# Patient Record
Sex: Male | Born: 1955 | Race: White | Hispanic: Yes | Marital: Married | Smoking: Never smoker
Health system: Southern US, Community
[De-identification: ages and names within clinical notes are randomized; demographics above are authoritative.]

## PROBLEM LIST (undated history)

## (undated) DIAGNOSIS — M109 Gout, unspecified: Secondary | ICD-10-CM

## (undated) DIAGNOSIS — E079 Disorder of thyroid, unspecified: Secondary | ICD-10-CM

## (undated) HISTORY — PX: KNEE SURGERY: SHX244

---

## 2002-05-20 ENCOUNTER — Encounter: Admission: RE | Admit: 2002-05-20 | Discharge: 2002-05-20 | Payer: Self-pay | Admitting: Family Medicine

## 2002-05-20 ENCOUNTER — Encounter: Payer: Self-pay | Admitting: Family Medicine

## 2002-07-23 ENCOUNTER — Other Ambulatory Visit: Admission: RE | Admit: 2002-07-23 | Discharge: 2002-07-23 | Payer: Self-pay | Admitting: Family Medicine

## 2006-06-05 ENCOUNTER — Ambulatory Visit: Payer: Self-pay | Admitting: Family Medicine

## 2006-06-05 LAB — CONVERTED CEMR LAB: TSH: 6.96 microintl units/mL — ABNORMAL HIGH (ref 0.35–5.50)

## 2006-06-25 ENCOUNTER — Ambulatory Visit: Payer: Self-pay | Admitting: Family Medicine

## 2006-06-25 LAB — CONVERTED CEMR LAB
Chol/HDL Ratio, serum: 5.6
Cholesterol: 204 mg/dL (ref 0–200)
Glucose, Bld: 100 mg/dL — ABNORMAL HIGH (ref 70–99)
HDL: 36.7 mg/dL — ABNORMAL LOW (ref >39.0)
LDL DIRECT: 95.9 mg/dL
Triglyceride fasting, serum: 266 mg/dL (ref 0–149)
VLDL: 53 mg/dL — ABNORMAL HIGH (ref 0–40)

## 2006-07-30 ENCOUNTER — Ambulatory Visit: Payer: Self-pay | Admitting: Family Medicine

## 2006-08-12 ENCOUNTER — Ambulatory Visit: Payer: Self-pay | Admitting: Family Medicine

## 2006-09-12 ENCOUNTER — Ambulatory Visit: Payer: Self-pay | Admitting: Family Medicine

## 2006-10-22 ENCOUNTER — Ambulatory Visit: Payer: Self-pay | Admitting: Gastroenterology

## 2006-11-15 ENCOUNTER — Encounter (INDEPENDENT_AMBULATORY_CARE_PROVIDER_SITE_OTHER): Payer: Self-pay | Admitting: Specialist

## 2006-11-15 ENCOUNTER — Ambulatory Visit: Payer: Self-pay | Admitting: Gastroenterology

## 2007-01-28 ENCOUNTER — Emergency Department (HOSPITAL_COMMUNITY): Admission: EM | Admit: 2007-01-28 | Discharge: 2007-01-29 | Payer: Self-pay | Admitting: Emergency Medicine

## 2007-01-31 ENCOUNTER — Ambulatory Visit: Payer: Self-pay | Admitting: Family Medicine

## 2007-01-31 DIAGNOSIS — M109 Gout, unspecified: Secondary | ICD-10-CM

## 2007-02-03 ENCOUNTER — Telehealth (INDEPENDENT_AMBULATORY_CARE_PROVIDER_SITE_OTHER): Payer: Self-pay | Admitting: *Deleted

## 2007-02-07 ENCOUNTER — Ambulatory Visit: Payer: Self-pay | Admitting: Family Medicine

## 2007-02-07 ENCOUNTER — Telehealth (INDEPENDENT_AMBULATORY_CARE_PROVIDER_SITE_OTHER): Payer: Self-pay | Admitting: Family Medicine

## 2007-02-11 ENCOUNTER — Telehealth (INDEPENDENT_AMBULATORY_CARE_PROVIDER_SITE_OTHER): Payer: Self-pay | Admitting: *Deleted

## 2007-02-19 LAB — CONVERTED CEMR LAB
BUN: 18 mg/dL (ref 6–23)
CO2: 23 meq/L (ref 19–32)
Calcium: 9.4 mg/dL (ref 8.4–10.5)
Chloride: 108 meq/L (ref 96–112)
Creatinine, Ser: 1.55 mg/dL — ABNORMAL HIGH (ref 0.40–1.50)
Glucose, Bld: 119 mg/dL — ABNORMAL HIGH (ref 70–99)
Potassium: 5.1 meq/L (ref 3.5–5.3)
Sodium: 142 meq/L (ref 135–145)
Uric Acid, Serum: 9.1 mg/dL — ABNORMAL HIGH (ref 2.4–7.0)

## 2007-04-03 ENCOUNTER — Ambulatory Visit: Payer: Self-pay | Admitting: Family Medicine

## 2007-04-03 ENCOUNTER — Telehealth (INDEPENDENT_AMBULATORY_CARE_PROVIDER_SITE_OTHER): Payer: Self-pay | Admitting: *Deleted

## 2007-04-03 DIAGNOSIS — E039 Hypothyroidism, unspecified: Secondary | ICD-10-CM | POA: Insufficient documentation

## 2007-04-03 LAB — CONVERTED CEMR LAB: Rapid Strep: NEGATIVE

## 2007-04-04 ENCOUNTER — Ambulatory Visit: Payer: Self-pay | Admitting: Family Medicine

## 2007-04-07 ENCOUNTER — Telehealth (INDEPENDENT_AMBULATORY_CARE_PROVIDER_SITE_OTHER): Payer: Self-pay | Admitting: *Deleted

## 2007-04-07 LAB — CONVERTED CEMR LAB
TSH: 4.276 microintl units/mL (ref 0.350–5.50)
Uric Acid, Serum: 7.4 mg/dL — ABNORMAL HIGH (ref 2.4–7.0)

## 2007-05-28 ENCOUNTER — Ambulatory Visit: Payer: Self-pay | Admitting: Family Medicine

## 2007-06-03 LAB — CONVERTED CEMR LAB
Cholesterol: 202 mg/dL (ref 0–200)
HDL: 26.3 mg/dL — ABNORMAL LOW (ref >39.0)
Total CHOL/HDL Ratio: 7.7
Triglycerides: 658 mg/dL (ref 0–149)

## 2007-06-06 ENCOUNTER — Telehealth (INDEPENDENT_AMBULATORY_CARE_PROVIDER_SITE_OTHER): Payer: Self-pay | Admitting: *Deleted

## 2007-06-18 ENCOUNTER — Ambulatory Visit: Payer: Self-pay | Admitting: Family Medicine

## 2007-06-23 ENCOUNTER — Encounter (INDEPENDENT_AMBULATORY_CARE_PROVIDER_SITE_OTHER): Payer: Self-pay | Admitting: *Deleted

## 2007-06-23 LAB — CONVERTED CEMR LAB
Cholesterol: 208 mg/dL (ref 0–200)
Direct LDL: 102.2 mg/dL
Total CHOL/HDL Ratio: 6.1
VLDL: 57 mg/dL — ABNORMAL HIGH (ref 0–40)

## 2007-09-10 ENCOUNTER — Ambulatory Visit: Payer: Self-pay | Admitting: Family Medicine

## 2007-09-10 DIAGNOSIS — N390 Urinary tract infection, site not specified: Secondary | ICD-10-CM | POA: Insufficient documentation

## 2007-09-10 DIAGNOSIS — R1012 Left upper quadrant pain: Secondary | ICD-10-CM

## 2007-09-16 ENCOUNTER — Telehealth (INDEPENDENT_AMBULATORY_CARE_PROVIDER_SITE_OTHER): Payer: Self-pay | Admitting: *Deleted

## 2007-09-17 ENCOUNTER — Telehealth (INDEPENDENT_AMBULATORY_CARE_PROVIDER_SITE_OTHER): Payer: Self-pay | Admitting: *Deleted

## 2007-10-01 ENCOUNTER — Ambulatory Visit: Payer: Self-pay | Admitting: Internal Medicine

## 2007-10-01 LAB — CONVERTED CEMR LAB: RBC / HPF: NONE SEEN (ref <3)

## 2007-10-06 ENCOUNTER — Telehealth (INDEPENDENT_AMBULATORY_CARE_PROVIDER_SITE_OTHER): Payer: Self-pay | Admitting: *Deleted

## 2007-10-13 ENCOUNTER — Ambulatory Visit: Payer: Self-pay | Admitting: Internal Medicine

## 2007-10-14 ENCOUNTER — Ambulatory Visit: Payer: Self-pay | Admitting: Internal Medicine

## 2007-10-17 LAB — CONVERTED CEMR LAB
ALT: 27 units/L (ref 0–53)
AST: 24 units/L (ref 0–37)
Albumin: 3.9 g/dL (ref 3.5–5.2)
Amylase: 68 units/L (ref 27–131)
Basophils Absolute: 0 10*3/uL (ref 0.0–0.1)
Basophils Relative: 0.9 % (ref 0.0–1.0)
Eosinophils Relative: 5.7 % — ABNORMAL HIGH (ref 0.0–5.0)
Hemoglobin: 14.3 g/dL (ref 13.0–17.0)
Lymphocytes Relative: 53.3 % — ABNORMAL HIGH (ref 12.0–46.0)
MCHC: 32.9 g/dL (ref 30.0–36.0)
Monocytes Relative: 17.5 % — ABNORMAL HIGH (ref 3.0–12.0)
Neutro Abs: 0.6 10*3/uL — ABNORMAL LOW (ref 1.4–7.7)
RBC: 4.85 M/uL (ref 4.22–5.81)
Total Protein: 7 g/dL (ref 6.0–8.3)

## 2007-10-19 ENCOUNTER — Telehealth (INDEPENDENT_AMBULATORY_CARE_PROVIDER_SITE_OTHER): Payer: Self-pay | Admitting: *Deleted

## 2007-11-25 ENCOUNTER — Ambulatory Visit: Payer: Self-pay | Admitting: Family Medicine

## 2007-11-28 ENCOUNTER — Ambulatory Visit: Payer: Self-pay | Admitting: Internal Medicine

## 2007-11-28 LAB — CONVERTED CEMR LAB
Basophils Absolute: 0 10*3/uL (ref 0.0–0.1)
Eosinophils Absolute: 0.4 10*3/uL (ref 0.0–0.7)
MCHC: 33.3 g/dL (ref 30.0–36.0)
MCV: 90.8 fL (ref 78.0–100.0)
Monocytes Absolute: 0.5 10*3/uL (ref 0.1–1.0)
Neutrophils Relative %: 53.9 % (ref 43.0–77.0)
Platelets: 208 10*3/uL (ref 150–400)

## 2008-02-09 ENCOUNTER — Telehealth (INDEPENDENT_AMBULATORY_CARE_PROVIDER_SITE_OTHER): Payer: Self-pay | Admitting: *Deleted

## 2008-02-12 ENCOUNTER — Telehealth (INDEPENDENT_AMBULATORY_CARE_PROVIDER_SITE_OTHER): Payer: Self-pay | Admitting: *Deleted

## 2008-06-09 ENCOUNTER — Ambulatory Visit: Payer: Self-pay | Admitting: Family Medicine

## 2008-06-09 DIAGNOSIS — M7710 Lateral epicondylitis, unspecified elbow: Secondary | ICD-10-CM | POA: Insufficient documentation

## 2008-06-28 ENCOUNTER — Encounter (INDEPENDENT_AMBULATORY_CARE_PROVIDER_SITE_OTHER): Payer: Self-pay | Admitting: *Deleted

## 2008-07-12 ENCOUNTER — Telehealth (INDEPENDENT_AMBULATORY_CARE_PROVIDER_SITE_OTHER): Payer: Self-pay | Admitting: *Deleted

## 2008-07-13 ENCOUNTER — Ambulatory Visit: Payer: Self-pay | Admitting: Family Medicine

## 2008-07-13 LAB — CONVERTED CEMR LAB: OCCULT 1: NEGATIVE

## 2008-07-14 ENCOUNTER — Encounter (INDEPENDENT_AMBULATORY_CARE_PROVIDER_SITE_OTHER): Payer: Self-pay | Admitting: *Deleted

## 2008-07-19 ENCOUNTER — Ambulatory Visit: Payer: Self-pay | Admitting: Family Medicine

## 2008-07-20 LAB — CONVERTED CEMR LAB
ALT: 31 units/L (ref 0–53)
AST: 22 units/L (ref 0–37)
GGT: 28 units/L (ref 7–51)
Total Bilirubin: 0.9 mg/dL (ref 0.3–1.2)
Total Protein: 7.6 g/dL (ref 6.0–8.3)

## 2008-07-21 ENCOUNTER — Encounter (INDEPENDENT_AMBULATORY_CARE_PROVIDER_SITE_OTHER): Payer: Self-pay | Admitting: *Deleted

## 2008-07-22 LAB — CONVERTED CEMR LAB
HCV Ab: NEGATIVE
Hepatitis B Surface Ag: NEGATIVE

## 2008-07-26 ENCOUNTER — Encounter (INDEPENDENT_AMBULATORY_CARE_PROVIDER_SITE_OTHER): Payer: Self-pay | Admitting: *Deleted

## 2008-09-27 ENCOUNTER — Ambulatory Visit: Payer: Self-pay | Admitting: Family Medicine

## 2008-09-27 ENCOUNTER — Encounter (INDEPENDENT_AMBULATORY_CARE_PROVIDER_SITE_OTHER): Payer: Self-pay | Admitting: *Deleted

## 2008-09-27 DIAGNOSIS — J309 Allergic rhinitis, unspecified: Secondary | ICD-10-CM | POA: Insufficient documentation

## 2008-09-27 DIAGNOSIS — R42 Dizziness and giddiness: Secondary | ICD-10-CM

## 2009-02-28 ENCOUNTER — Ambulatory Visit: Payer: Self-pay | Admitting: Family Medicine

## 2009-02-28 DIAGNOSIS — K219 Gastro-esophageal reflux disease without esophagitis: Secondary | ICD-10-CM | POA: Insufficient documentation

## 2009-03-03 LAB — CONVERTED CEMR LAB
ALT: 29 units/L (ref 0–53)
Alkaline Phosphatase: 47 units/L (ref 39–117)
Amylase: 92 units/L (ref 27–131)
BUN: 19 mg/dL (ref 6–23)
Bilirubin, Direct: 0 mg/dL (ref 0.0–0.3)
Calcium: 9.5 mg/dL (ref 8.4–10.5)
Cholesterol: 220 mg/dL — ABNORMAL HIGH (ref 0–200)
Creatinine, Ser: 1.2 mg/dL (ref 0.4–1.5)
Direct LDL: 104 mg/dL
Eosinophils Relative: 5.1 % — ABNORMAL HIGH (ref 0.0–5.0)
GFR calc non Af Amer: 67.36 mL/min (ref >60)
H Pylori IgG: POSITIVE
HDL: 35.4 mg/dL — ABNORMAL LOW (ref >39.00)
Lymphocytes Relative: 29.9 % (ref 12.0–46.0)
MCV: 91.6 fL (ref 78.0–100.0)
Monocytes Absolute: 0.5 10*3/uL (ref 0.1–1.0)
Monocytes Relative: 5.5 % (ref 3.0–12.0)
Neutrophils Relative %: 59 % (ref 43.0–77.0)
PSA: 0.54 ng/mL (ref 0.10–4.00)
Platelets: 208 10*3/uL (ref 150.0–400.0)
Total Bilirubin: 1.1 mg/dL (ref 0.3–1.2)
Total CHOL/HDL Ratio: 6
Triglycerides: 330 mg/dL — ABNORMAL HIGH (ref 0.0–149.0)
VLDL: 66 mg/dL — ABNORMAL HIGH (ref 0.0–40.0)
WBC: 8.4 10*3/uL (ref 4.5–10.5)

## 2009-03-08 ENCOUNTER — Telehealth (INDEPENDENT_AMBULATORY_CARE_PROVIDER_SITE_OTHER): Payer: Self-pay | Admitting: *Deleted

## 2009-03-17 ENCOUNTER — Encounter (INDEPENDENT_AMBULATORY_CARE_PROVIDER_SITE_OTHER): Payer: Self-pay | Admitting: *Deleted

## 2009-03-25 ENCOUNTER — Telehealth (INDEPENDENT_AMBULATORY_CARE_PROVIDER_SITE_OTHER): Payer: Self-pay | Admitting: *Deleted

## 2009-04-25 ENCOUNTER — Ambulatory Visit: Payer: Self-pay | Admitting: Family Medicine

## 2009-05-13 ENCOUNTER — Ambulatory Visit: Payer: Self-pay | Admitting: Gastroenterology

## 2009-05-13 DIAGNOSIS — R1013 Epigastric pain: Secondary | ICD-10-CM | POA: Insufficient documentation

## 2009-05-16 ENCOUNTER — Ambulatory Visit: Payer: Self-pay | Admitting: Gastroenterology

## 2009-05-16 ENCOUNTER — Encounter: Payer: Self-pay | Admitting: Gastroenterology

## 2009-05-20 ENCOUNTER — Encounter: Payer: Self-pay | Admitting: Gastroenterology

## 2010-01-26 ENCOUNTER — Ambulatory Visit: Payer: Self-pay | Admitting: Family Medicine

## 2010-01-26 DIAGNOSIS — F411 Generalized anxiety disorder: Secondary | ICD-10-CM | POA: Insufficient documentation

## 2010-01-27 ENCOUNTER — Telehealth: Payer: Self-pay | Admitting: Family Medicine

## 2010-02-27 ENCOUNTER — Ambulatory Visit: Payer: Self-pay | Admitting: Family Medicine

## 2010-02-27 DIAGNOSIS — M79609 Pain in unspecified limb: Secondary | ICD-10-CM | POA: Insufficient documentation

## 2010-02-27 DIAGNOSIS — E785 Hyperlipidemia, unspecified: Secondary | ICD-10-CM

## 2010-02-28 LAB — CONVERTED CEMR LAB
BUN: 17 mg/dL (ref 6–23)
Basophils Absolute: 0 10*3/uL (ref 0.0–0.1)
Chloride: 108 meq/L (ref 96–112)
Free T4: 0.94 ng/dL (ref 0.60–1.60)
Glucose, Bld: 106 mg/dL — ABNORMAL HIGH (ref 70–99)
HCT: 42.3 % (ref 39.0–52.0)
HDL: 32.3 mg/dL — ABNORMAL LOW (ref >39.00)
Lymphs Abs: 2.4 10*3/uL (ref 0.7–4.0)
MCV: 89.3 fL (ref 78.0–100.0)
Monocytes Absolute: 0.5 10*3/uL (ref 0.1–1.0)
PSA: 0.52 ng/mL (ref 0.10–4.00)
Platelets: 198 10*3/uL (ref 150.0–400.0)
Potassium: 4.3 meq/L (ref 3.5–5.1)
RDW: 14 % (ref 11.5–14.6)
TSH: 3.85 microintl units/mL (ref 0.35–5.50)
Total Bilirubin: 0.9 mg/dL (ref 0.3–1.2)
Uric Acid, Serum: 8.6 mg/dL — ABNORMAL HIGH (ref 4.0–7.8)

## 2010-03-01 ENCOUNTER — Encounter: Payer: Self-pay | Admitting: Internal Medicine

## 2010-08-06 LAB — CONVERTED CEMR LAB
AST: 17 units/L (ref 0–37)
Albumin: 4 g/dL (ref 3.5–5.2)
Albumin: 4.1 g/dL (ref 3.5–5.2)
Alkaline Phosphatase: 43 units/L (ref 39–117)
Alkaline Phosphatase: 46 units/L (ref 39–117)
BUN: 16 mg/dL (ref 6–23)
BUN: 19 mg/dL (ref 6–23)
Bilirubin Urine: NEGATIVE
Calcium: 9.5 mg/dL (ref 8.4–10.5)
Chloride: 109 meq/L (ref 96–112)
Cholesterol: 192 mg/dL (ref 0–200)
Creatinine, Ser: 1.4 mg/dL (ref 0.4–1.5)
Direct LDL: 106.1 mg/dL
Direct LDL: 119.8 mg/dL
Eosinophils Absolute: 0.3 10*3/uL (ref 0.0–0.7)
Eosinophils Relative: 4.4 % (ref 0.0–5.0)
Eosinophils Relative: 5.4 % — ABNORMAL HIGH (ref 0.0–5.0)
GFR calc Af Amer: 68 mL/min
GFR calc non Af Amer: 57 mL/min
GFR calc non Af Amer: 67.47 mL/min (ref >60)
Glucose, Bld: 108 mg/dL — ABNORMAL HIGH (ref 70–99)
Glucose, Bld: 97 mg/dL (ref 70–99)
Glucose, Urine, Semiquant: NEGATIVE
HCT: 42.7 % (ref 39.0–52.0)
HCT: 44.9 % (ref 39.0–52.0)
Hemoglobin: 15.1 g/dL (ref 13.0–17.0)
Hemoglobin: 15.6 g/dL (ref 13.0–17.0)
Ketones, urine, test strip: NEGATIVE
Lymphs Abs: 2.4 10*3/uL (ref 0.7–4.0)
MCV: 88.8 fL (ref 78.0–100.0)
MCV: 90 fL (ref 78.0–100.0)
Monocytes Absolute: 0.4 10*3/uL (ref 0.1–1.0)
Monocytes Absolute: 0.6 10*3/uL (ref 0.1–1.0)
Monocytes Relative: 5.7 % (ref 3.0–12.0)
Monocytes Relative: 9.6 % (ref 3.0–12.0)
Neutro Abs: 3.4 10*3/uL (ref 1.4–7.7)
Neutro Abs: 3.6 10*3/uL (ref 1.4–7.7)
Nitrite: NEGATIVE
Platelets: 188 10*3/uL (ref 150–400)
Platelets: 192 10*3/uL (ref 150.0–400.0)
Potassium: 4.5 meq/L (ref 3.5–5.1)
Potassium: 4.6 meq/L (ref 3.5–5.1)
Protein, U semiquant: NEGATIVE
RDW: 12.5 % (ref 11.5–14.6)
Sodium: 143 meq/L (ref 135–145)
Specific Gravity, Urine: <1.005
TSH: 6.51 microintl units/mL — ABNORMAL HIGH (ref 0.35–5.50)
Total Protein: 7.2 g/dL (ref 6.0–8.3)
Total Protein: 7.2 g/dL (ref 6.0–8.3)
Triglycerides: 201 mg/dL (ref 0–149)
Uric Acid, Serum: 9.3 mg/dL — ABNORMAL HIGH (ref 4.0–7.8)
Uric Acid, Serum: 9.8 mg/dL — ABNORMAL HIGH (ref 4.0–7.8)
WBC Urine, dipstick: NEGATIVE
WBC: 6.4 10*3/uL (ref 4.5–10.5)
WBC: 6.5 10*3/uL (ref 4.5–10.5)
pH: 6

## 2010-08-08 NOTE — Assessment & Plan Note (Signed)
Summary: rto 1 month- lab lipid-hep-bmp-cbcd-tsh-psa-:244.9-272.4/cbs   Vital Signs:  Patient profile:   55 year old male Weight:      192 pounds BMI:     31.10 Pulse rate:   76 / minute BP sitting:   126 / 88  (left arm)  Vitals Entered By: Almeta Monas CMA Duncan Dull) (February 27, 2010 9:34 AM) CC: 1 mo f/u with labs, pt is fasting    History of Present Illness: Pt here for f/u and labs.  Pt still c/o R elbow pain.  It is better but not completely.  Celebrex helped  but he ran out.  Pt also c/o b/l leg pain in am when he first gets up--- he has no trouble walking.  No calf pain , no swelling.  No errythema. ---symptoms x1 month    Hyperlipidemia follow-up      This is a 55 year old man who presents for Hyperlipidemia follow-up.  The patient complains of muscle aches, but denies GI upset, abdominal pain, flushing, itching, constipation, diarrhea, and fatigue.  The patient denies the following symptoms: chest pain/pressure, exercise intolerance, dypsnea, palpitations, syncope, and pedal edema.  Compliance with medications (by patient report) has been near 100%.  Dietary compliance has been good.    Hyperlipidemia follow-up      Adjunctive measures currently used by the patient include limiting alcohol consumpton.    Current Medications (verified): 1)  Synthroid 75 Mcg  Tabs (Levothyroxine Sodium) .... Take One Tablet Daily. 2)  Allopurinol 300 Mg Tabs (Allopurinol) .Marland Kitchen.. 1 By Mouth Twice Daily As Needed 3)  Trilipix 135 Mg Cpdr (Choline Fenofibrate) .... Take One Once Daily. 4)  Nexium 40 Mg Cpdr (Esomeprazole Magnesium) .Marland Kitchen.. 1 By Mouth Once Daily 5)  Celebrex 200 Mg Caps (Celecoxib) .Marland Kitchen.. 1 By Mouth Once Daily As Needed 6)  Celexa 20 Mg Tabs (Citalopram Hydrobromide) .... Take 1 Tab Once Daily  Allergies (verified): No Known Drug Allergies  Past History:  Past medical, surgical, family and social histories (including risk factors) reviewed for relevance to current acute and  chronic problems.  Past Medical History: Reviewed history from 06/09/2008 and no changes required. Hypothyroidism Current Problems:  UTI (ICD-599.0) ABDOMINAL PAIN, LEFT UPPER QUADRANT (ICD-789.02) PREVENTIVE HEALTH CARE (ICD-V70.0) HYPOTHYROIDISM (ICD-244.9) GOUT NOS (ICD-274.9)  Past Surgical History: Reviewed history from 05/28/2007 and no changes required. right knee surgery  Family History: Reviewed history from 06/09/2008 and no changes required. cancer throat--Father and mother  Social History: Reviewed history from 05/28/2007 and no changes required. Occupation: [employer] Mar[employer] Never Smoked Alcohol use-no Drug use-no Regular exercise-no  Review of Systems      See HPI  Physical Exam  General:  Well-developed,well-nourished,in no acute distress; alert,appropriate and cooperative throughout examination Neck:  No deformities, masses, or tenderness noted. Lungs:  Normal respiratory effort, chest expands symmetrically. Lungs are clear to auscultation, no crackles or wheezes. Heart:  normal rate and no murmur.   Extremities:  No clubbing, cyanosis, edema, or deformity noted with normal full range of motion of all joints.   no calf pain Neurologic:  alert & oriented X3 and strength normal in all extremities.   Skin:  Intact without suspicious lesions or rashes Psych:  Oriented X3 and normally interactive.     Impression & Recommendations:  Problem # 1:  HYPERLIPIDEMIA (ICD-272.4)  His updated medication list for this problem includes:    Trilipix 135 Mg Cpdr (Choline fenofibrate) .Marland Kitchen... Take one once daily.  Labs Reviewed: SGOT: 21 (02/28/2009)   SGPT:  29 (02/28/2009)   HDL:35.40 (02/28/2009), 34.70 (09/27/2008)  LDL:DEL (06/09/2008), DEL (06/18/2007)  Chol:220 (02/28/2009), 207 (09/27/2008)  Trig:330.0 (02/28/2009), 185.0 (09/27/2008)  Problem # 2:  GERD (ICD-530.81)  His updated medication list for this problem includes:    Nexium 40 Mg Cpdr (Esomeprazole  magnesium) .Marland Kitchen... 1 by mouth once daily  Diagnostics Reviewed:  EGD: DONE (05/16/2009) Discussed lifestyle modifications, diet, antacids/medications, and preventive measures. Handout provided.   Problem # 3:  LATERAL EPICONDYLITIS, RIGHT (ICD-726.32)  Orders: Orthopedic Surgeon Referral (Ortho Surgeon)  Problem # 4:  LEG PAIN, BILATERAL (ICD-729.5) no pain with walking doubt claudication pain only usually in am  celebrex helps compression hose  Problem # 5:  HYPOTHYROIDISM (ICD-244.9)  His updated medication list for this problem includes:    Synthroid 75 Mcg Tabs (Levothyroxine sodium) .Marland Kitchen... Take one tablet daily.  Orders: Venipuncture (13086) TLB-Lipid Panel (80061-LIPID) TLB-BMP (Basic Metabolic Panel-BMET) (80048-METABOL) TLB-CBC Platelet - w/Differential (85025-CBCD) TLB-Hepatic/Liver Function Pnl (80076-HEPATIC) TLB-Uric Acid, Blood (84550-URIC) TLB-TSH (Thyroid Stimulating Hormone) (84443-TSH) TLB-T3, Free (Triiodothyronine) (84481-T3FREE) TLB-T4 (Thyrox), Free [phone])  Labs Reviewed: TSH: 2.28 (02/28/2009)    Chol: 220 (02/28/2009)   HDL: 35.40 (02/28/2009)   LDL: DEL (06/09/2008)   TG: 330.0 (02/28/2009)  Problem # 6:  GOUT NOS (ICD-274.9)  His updated medication list for this problem includes:    Allopurinol 300 Mg Tabs (Allopurinol) .Marland Kitchen... 1 by mouth twice daily as needed    Celebrex 200 Mg Caps (Celecoxib) .Marland Kitchen... 1 by mouth once daily as needed  Orders: Venipuncture (52841) TLB-Lipid Panel (80061-LIPID) TLB-BMP (Basic Metabolic Panel-BMET) (80048-METABOL) TLB-CBC Platelet - w/Differential (85025-CBCD) TLB-Hepatic/Liver Function Pnl (80076-HEPATIC) TLB-Uric Acid, Blood (84550-URIC)  Elevate extremity; warm compresses, symptomatic relief and medication as directed.   Complete Medication List: 1)  Synthroid 75 Mcg Tabs (Levothyroxine sodium) .... Take one tablet daily. 2)  Allopurinol 300 Mg Tabs (Allopurinol) .Marland Kitchen.. 1 by mouth twice daily as  needed 3)  Trilipix 135 Mg Cpdr (Choline fenofibrate) .... Take one once daily. 4)  Nexium 40 Mg Cpdr (Esomeprazole magnesium) .Marland Kitchen.. 1 by mouth once daily 5)  Celebrex 200 Mg Caps (Celecoxib) .Marland Kitchen.. 1 by mouth once daily as needed 6)  Celexa 20 Mg Tabs (Citalopram hydrobromide) .... Take 1 tab once daily  Other Orders: TLB-PSA (Prostate Specific Antigen) (84153-PSA)  Patient Instructions: 1)  return for physicial --3-6 months Prescriptions: CELEBREX 200 MG CAPS (CELECOXIB) 1 by mouth once daily as needed  #30 x 1   Entered and Authorized by:   Loreen Freud DO   Signed by:   Loreen Freud DO on 02/27/2010   Method used:   Electronically to        [facility] [address] (retail)       [phone]       [place], [place]  [address]       Ph: [phone]       Fax: [phone]   RxID:   [id number]

## 2010-08-08 NOTE — Assessment & Plan Note (Signed)
Summary: pain in right arm//kn   Vital Signs:  Patient profile:   55 year old male Height:      66 inches Weight:      194 pounds Temp:     98.4 degrees F oral Pulse rate:   72 / minute BP sitting:   100 / 72  (left arm)  Vitals Entered By: Jeremy Johann CMA (January 26, 2010 2:59 PM) CC: PAIN IN RIGHT ARM   History of Present Illness:  Injury      This is a 55 year old man who presents with An injury.  The symptoms began 1 week ago.  The patient reports injury to the right elbow.  The patient also reports swelling, tenderness, and weakness.  The patient denies redness, increased warmth deformity, blood loss, numbness, loss of sensation, coolness of extremity, and loss of consciousness.  The patient denies the following risk factors for significant bleeding: aspirin use, anticoagulant use, and history of bleeding disorder.    Current Medications (verified): 1)  Synthroid 75 Mcg  Tabs (Levothyroxine Sodium) .... Take One Tablet Daily. 2)  Tylenol Arthritis Pain 650 Mg Cr-Tabs (Acetaminophen) .Marland Kitchen.. 1 By Mouth Every 6 Hours As Needed 3)  Allopurinol 300 Mg Tabs (Allopurinol) .Marland Kitchen.. 1 By Mouth Twice Daily As Needed 4)  Trilipix 135 Mg Cpdr (Choline Fenofibrate) .... Take One Once Daily. 5)  Nexium 40 Mg Cpdr (Esomeprazole Magnesium) .Marland Kitchen.. 1 By Mouth Once Daily 6)  Celebrex 200 Mg Caps (Celecoxib) .Marland Kitchen.. 1 By Mouth Once Daily As Needed 7)  Lexapro 10 Mg Tabs (Escitalopram Oxalate) .Marland Kitchen.. 1 By Mouth Qpm  Allergies (verified): No Known Drug Allergies  Past History:  Past medical, surgical, family and social histories (including risk factors) reviewed for relevance to current acute and chronic problems.  Past Medical History: Reviewed history from 06/09/2008 and no changes required. Hypothyroidism Current Problems:  UTI (ICD-599.0) ABDOMINAL PAIN, LEFT UPPER QUADRANT (ICD-789.02) PREVENTIVE HEALTH CARE (ICD-V70.0) HYPOTHYROIDISM (ICD-244.9) GOUT NOS (ICD-274.9)  Past Surgical  History: Reviewed history from 05/28/2007 and no changes required. right knee surgery  Family History: Reviewed history from 06/09/2008 and no changes required. cancer throat--Father and mother  Social History: Reviewed history from 05/28/2007 and no changes required. Occupation: [employer] Never Smoked Alcohol use-no Drug use-no Regular exercise-no  Review of Systems      See HPI  Physical Exam  General:  Well-developed,well-nourished,in no acute distress; alert,appropriate and cooperative throughout examination Msk:  R elbow + tenderness over Lat epicond.normal ROM, no joint swelling, no joint warmth, no redness over joints, and no joint deformities.   Neurologic:  alert & oriented X3 and gait normal.   Psych:  Oriented X3 and normally interactive.     Impression & Recommendations:  Problem # 1:  LATERAL EPICONDYLITIS, RIGHT (ICD-726.32) celebrex 200mg  daily ice/ heat rest Orders: Splints- All Types (O1308)  Problem # 2:  GERD (ICD-530.81)  His updated medication list for this problem includes:    Nexium 40 Mg Cpdr (Esomeprazole magnesium) .Marland Kitchen... 1 by mouth once daily  Diagnostics Reviewed:  EGD: DONE (05/16/2009) Discussed lifestyle modifications, diet, antacids/medications, and preventive measures. Handout provided.   Problem # 3:  HYPOTHYROIDISM (ICD-244.9)  His updated medication list for this problem includes:    Synthroid 75 Mcg Tabs (Levothyroxine sodium) .Marland Kitchen... Take one tablet daily.  Labs Reviewed: TSH: 2.28 (02/28/2009)    Chol: 220 (02/28/2009)   HDL: 35.40 (02/28/2009)   LDL: DEL (06/09/2008)   TG: 330.0 (02/28/2009)  Problem # 4:  ANXIETY STATE, UNSPECIFIED (ICD-300.00)  His updated medication list for this problem includes:    Lexapro 10 Mg Tabs (Escitalopram oxalate) .Marland Kitchen... 1 by mouth qpm  Discussed medication use and relaxation techniques.   Complete Medication List: 1)  Synthroid 75 Mcg Tabs (Levothyroxine sodium) .... Take one tablet  daily. 2)  Tylenol Arthritis Pain 650 Mg Cr-tabs (Acetaminophen) .Marland Kitchen.. 1 by mouth every 6 hours as needed 3)  Allopurinol 300 Mg Tabs (Allopurinol) .Marland Kitchen.. 1 by mouth twice daily as needed 4)  Trilipix 135 Mg Cpdr (Choline fenofibrate) .... Take one once daily. 5)  Nexium 40 Mg Cpdr (Esomeprazole magnesium) .Marland Kitchen.. 1 by mouth once daily 6)  Celebrex 200 Mg Caps (Celecoxib) .Marland Kitchen.. 1 by mouth once daily as needed 7)  Lexapro 10 Mg Tabs (Escitalopram oxalate) .Marland Kitchen.. 1 by mouth qpm  Patient Instructions: 1)  Please schedule a follow-up appointment in 1 month to f/u anxiety 2)  fasting labs 244.9  272.4  lipid, hep, bmp,cbcd, tsh, psa 3)  schedule physical Prescriptions: LEXAPRO 10 MG TABS (ESCITALOPRAM OXALATE) 1 by mouth qpm  #30 x 1   Entered and Authorized by:   Loreen Freud DO   Signed by:   Loreen Freud DO on 01/26/2010   Method used:   Electronically to        [facility] [address] (retail)       [phone]       [place], [place]  [address]       Ph: [phone]       Fax: [phone]   RxID:   [id number]

## 2010-08-08 NOTE — Letter (Signed)
Summary: Out of Work  [facility] [employer]  [address] [place], [place] [address]   Phone: [phone]  Fax: [phone]    January 26, 2010   Employee:  ISACK LAVALLEY    To Whom It May Concern:   For Medical reasons, please excuse the above named employee from work for the following dates:  Start:   01/26/2010  End:   01/28/2010  If you need additional information, please feel free to contact our office.         Sincerely,    Loreen Freud DO

## 2010-08-08 NOTE — Progress Notes (Signed)
Summary: prior auth  Phone Note Refill Request Message from:  Fax from Pharmacy on January 27, 2010 9:31 AM  Refills Requested: Medication #1:  LEXAPRO 10 MG TABS 1 by mouth qpm. prior Berkley Harvey  [phone]----- [facility] fax [phone]  Initial call taken by: Okey Regal [place],  January 27, 2010 9:32 AM  Follow-up for Phone Call        per dr Laury Axon change med to celexa 20 mg once daily, pt notified.................Marland KitchenFelecia Deloach CMA  January 27, 2010 5:05 PM      New/Updated Medications: CELEXA 20 MG TABS (CITALOPRAM HYDROBROMIDE) take 1 tab once daily Prescriptions: CELEXA 20 MG TABS (CITALOPRAM HYDROBROMIDE) take 1 tab once daily  #30 x 1   Entered by:   Jeremy Johann CMA   Authorized by:   Loreen Freud DO   Signed by:   Jeremy Johann CMA on 01/27/2010   Method used:   Faxed to ...       [facility] [address] (retail)       [phone]       [place], [place]  [address]       Ph: [phone]       Fax: [phone]   RxID:   [id number]

## 2010-08-08 NOTE — Letter (Signed)
Summary: [facility]  [facility]   Imported By: Lanelle Bal 03/14/2010 10:22:37  _____________________________________________________________________  External Attachment:    Type:   Image     Comment:   External Document

## 2010-11-21 NOTE — Assessment & Plan Note (Signed)
[facility]                                 ON-CALL NOTE   Edwin Schneider, Edwin Schneider                      MRN:          [id number]  DATE:01/31/2007                            DOB:          03/08/1956    TIME OF CALL:  5:49pm.   PATIENT OF:  Dr. Blossom Hoops.   TELEPHONE NUMBER:  [phone]   The patient states that he is out of medications for the weekend and  asks that I call him in refills. Our conversation was very difficult  because he speaks [place] and speaks very little [place]. It seems that  he may have gout, but I am not quite sure. At any rate, I spoke to [facility]  at [facility] at [phone] and indeed, his two prescriptions are  Colchicine 0.6 mg and ibuprofen 400 mg to use as needed. I did call in  Colchicine as above to take 4 times a day as needed for pain, also  ibuprofen as above to take 4 times a day as needed for pain. I called in  number 20 of both with no refills and advised him to follow up with Dr.  Blossom Hoops next week.     Tera Mater. Clent Ridges, MD  Electronically Signed    SAF/MedQ  DD: 01/31/2007  DT: 02/02/2007  Job #: [id number]

## 2010-11-24 NOTE — Assessment & Plan Note (Signed)
[name]                        [place] OFFICE NOTE   Edwin Schneider, Edwin Schneider                    MRN:          [id number]  DATE:09/12/2006                            DOB:          Apr 21, 1956    REASON FOR VISIT:  Sore throat.   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  At  that point, he was given Nexium and advised to follow up if his symptoms  did not improve for referral to GI.  The patient states his symptoms had  improved significantly until  the last several months.  He complains of  tightness in his throat with some mild difficulties with swallowing.  These are similar symptoms that he has had back in 2003.  Of note, I did  refer him in December to get a screening colonoscopy which the patient  reports he did not have done.  He reports he may have missed the phone  call regarding the appointment.   The patient has been on Nexium daily for the last several weeks with no  significant improvement in his symptoms.  Additionally, he started  yesterday with omeprazole and sucralfate, and that was given to him by  his sister.   PAST MEDICAL HISTORY:  1. Hypothyroidism.  2. History of H.pylori.  3. Laryngeal reflux  diagnosed by Edwin Schneider in 2003.  Symptoms      resolved and recurred over the last several months.   PAST SURGICAL HISTORY:  Right meniscal tear repair.   MEDICATIONS:  1. Synthroid 75 mcg.  2. Nexium one daily over the last 4-6 weeks.   ALLERGIES:  PENICILLIN.   OBJECTIVE:  VITAL SIGNS:  Weight 192.8, temperature 98.2, pulse 64,  blood pressure 122/80.  GENERAL:  We have a pleasant Hispanic male in no acute distress.  HEENT:  TM clear bilaterally.  Nasal mucosa benign.  Oropharynx is  normal appearing.  NECK:  Supple.  No lymphadenopathy, carotid bruits or JVD.  Trachea was  midline.  LUNGS:  Clear.  HEART:   Regular rate and rhythm.  ABDOMEN:  Soft, nondistended, no palpable masses or hepatosplenomegaly.   IMPRESSION:  A 55 year old male with history of laryngeal/pharyngeal  reflux with recurrent symptoms not relieved with Nexium.   PLAN:  1. Advised patient that I will reschedule an appointment with      Gastroenterology for screening colonoscopy as well as further      evaluation regarding his throat discomfort.  2. Advised patient of importance of follow up with the above      recommendations.  His sisters were present and may accompany him at      his next visit with GI.  3. Patient expressed understanding.     Leanne Chang, M.D.  Electronically Signed    LA/MedQ  DD: 09/12/2006  DT: 09/12/2006  Job #: [id number]

## 2010-11-24 NOTE — Assessment & Plan Note (Signed)
[facility]                         GASTROENTEROLOGY OFFICE NOTE   SAVAN, RUTA                    MRN:          [id number]  DATE:10/22/2006                            DOB:          26-Aug-1955    REASON FOR REFERRAL:  Colorectal cancer screening and question of GERD  symptoms.   HISTORY OF PRESENT ILLNESS:  Mr. Boy is a very pleasant 55-year-  old man who has never had trouble with his bowels. No bleeding,  constipation. No diarrhea. He was referred for colorectal cancer  screening with colonoscopy. He has had at least 2 to 3 year history of  feeling of phlegm being caught in his throat and mouth. He was seen by  an ENT doctor about a year ago, from what he tells me, and they looked  inside. There was no cancer, but they thought he did have some reflux-  related changes in his oropharynx. They recommended him to begin a  proton pump inhibitor which he has been taking intermittently with a cup  of coffee in the morning and he does not eat anything for at least 3 to  4 hours. He has no frank dysphagia and no more overt GERD symptoms of  pyrosis or acid regurgitation or water brash.   REVIEW OF SYSTEMS:  Is essentially normal and is available on his  Nursing Intake Sheet.   PAST MEDICAL HISTORY:  1. Elevated cholesterol.  2. Low thyroid.   CURRENT MEDICATIONS:  1. Synthroid.  2. Nexium. His last Nexium was a week or two ago. He cannot afford the      high co-pay.   ALLERGIES:  PENICILLIN WHICH CAUSES A RASH.   SOCIAL HISTORY:  Married with three children, nonsmoker and nondrinker.  Works as a Curator.   FAMILY HISTORY:  No colon cancer, colon polyps in the family.   PHYSICAL EXAMINATION:  Weight 191 pounds, blood pressure 122/88, pulse  88.  CONSTITUTIONAL: Generally well-appearing.  NEUROLOGIC: Alert and oriented x3.  EYES: Extraocular movements intact.  MOUTH: Oropharynx moist. No lesions.  NECK: Supple. No  lymphadenopathy.  CARDIOVASCULAR: HEART: Regular rate and rhythm.  LUNGS:  Clear to auscultation bilaterally.  ABDOMEN: Soft and nontender, nondistended. Normal bowel sounds.  EXTREMITIES: No lower extremity edema.  SKIN: No rashes or lesions on visible extremities.   A 55 year old man, Spanish speaking predominantly, with routine risks  for colorectal cancer, ENT evaluation suggesting gastroesophageal  reflux.   He does think his phlegm production seems a bit better when he is on  proton pump inhibitor. He is not taking it at correct time in relation  to food and is not taking it all recently because of too high of co-pay.  I have phoned in omeprazole 40 mg once daily to his pharmacy with  instructions to him that if that if this is too high of a co-pay they  need to let us know which will be the one with the cheapest co-pay for  him and I am happy to prescribe him that. The best way to take it is 20  to 30 minutes prior to a breakfast  meal. He takes it with a cup of  coffee 3 to 4 hours prior to breakfast right now. Will arrange for him  to have colonoscopy done at his soonest convenience. If his  oropharyngeal symptoms do not improve after about a month after properly  taking PPI, then we will have to consider endoscopy at that point.     Rachael Fee, MD  Electronically Signed    DPJ/MedQ  DD: 10/22/2006  DT: 10/22/2006  Job #: [id number]   cc:   Leanne Chang, M.D.

## 2010-12-25 ENCOUNTER — Other Ambulatory Visit: Payer: Self-pay | Admitting: Family Medicine

## 2010-12-26 NOTE — Telephone Encounter (Signed)
Pt is Dr.Lowne pt- has not been seen since 02/2010 (saw Dr.Lowne for a follow up then and labs)

## 2011-04-23 LAB — URIC ACID: Uric Acid, Serum: 8.4 — ABNORMAL HIGH

## 2014-02-11 ENCOUNTER — Emergency Department (HOSPITAL_COMMUNITY): Payer: BC Managed Care – PPO

## 2014-02-11 ENCOUNTER — Inpatient Hospital Stay (HOSPITAL_COMMUNITY): Payer: BC Managed Care – PPO

## 2014-02-11 ENCOUNTER — Inpatient Hospital Stay (HOSPITAL_COMMUNITY)
Admission: EM | Admit: 2014-02-11 | Discharge: 2014-02-17 | DRG: 516 | Disposition: A | Discharge status: Home Health | Payer: BC Managed Care – PPO | Attending: General Surgery | Admitting: General Surgery

## 2014-02-11 ENCOUNTER — Encounter (HOSPITAL_COMMUNITY): Payer: Self-pay | Admitting: Emergency Medicine

## 2014-02-11 DIAGNOSIS — D62 Acute posthemorrhagic anemia: Secondary | ICD-10-CM | POA: Diagnosis not present

## 2014-02-11 DIAGNOSIS — M109 Gout, unspecified: Secondary | ICD-10-CM | POA: Diagnosis present

## 2014-02-11 DIAGNOSIS — S060X9A Concussion with loss of consciousness of unspecified duration, initial encounter: Secondary | ICD-10-CM | POA: Diagnosis present

## 2014-02-11 DIAGNOSIS — IMO0002 Reserved for concepts with insufficient information to code with codable children: Secondary | ICD-10-CM | POA: Diagnosis present

## 2014-02-11 DIAGNOSIS — Z8711 Personal history of peptic ulcer disease: Secondary | ICD-10-CM | POA: Diagnosis not present

## 2014-02-11 DIAGNOSIS — R141 Gas pain: Secondary | ICD-10-CM | POA: Diagnosis not present

## 2014-02-11 DIAGNOSIS — W19XXXA Unspecified fall, initial encounter: Secondary | ICD-10-CM

## 2014-02-11 DIAGNOSIS — I959 Hypotension, unspecified: Secondary | ICD-10-CM | POA: Diagnosis present

## 2014-02-11 DIAGNOSIS — S32609A Unspecified fracture of unspecified ischium, initial encounter for closed fracture: Secondary | ICD-10-CM | POA: Diagnosis present

## 2014-02-11 DIAGNOSIS — S3282XA Multiple fractures of pelvis without disruption of pelvic ring, initial encounter for closed fracture: Principal | ICD-10-CM | POA: Diagnosis present

## 2014-02-11 DIAGNOSIS — N35919 Unspecified urethral stricture, male, unspecified site: Secondary | ICD-10-CM | POA: Diagnosis present

## 2014-02-11 DIAGNOSIS — K560 Paralytic ileus: Secondary | ICD-10-CM | POA: Diagnosis not present

## 2014-02-11 DIAGNOSIS — E039 Hypothyroidism, unspecified: Secondary | ICD-10-CM | POA: Diagnosis present

## 2014-02-11 DIAGNOSIS — R339 Retention of urine, unspecified: Secondary | ICD-10-CM | POA: Diagnosis present

## 2014-02-11 DIAGNOSIS — Z79899 Other long term (current) drug therapy: Secondary | ICD-10-CM

## 2014-02-11 DIAGNOSIS — E785 Hyperlipidemia, unspecified: Secondary | ICD-10-CM | POA: Diagnosis present

## 2014-02-11 DIAGNOSIS — K5900 Constipation, unspecified: Secondary | ICD-10-CM | POA: Diagnosis not present

## 2014-02-11 DIAGNOSIS — R404 Transient alteration of awareness: Secondary | ICD-10-CM | POA: Diagnosis present

## 2014-02-11 DIAGNOSIS — S32009A Unspecified fracture of unspecified lumbar vertebra, initial encounter for closed fracture: Secondary | ICD-10-CM | POA: Diagnosis present

## 2014-02-11 DIAGNOSIS — E079 Disorder of thyroid, unspecified: Secondary | ICD-10-CM | POA: Diagnosis present

## 2014-02-11 DIAGNOSIS — R143 Flatulence: Secondary | ICD-10-CM | POA: Diagnosis not present

## 2014-02-11 DIAGNOSIS — IMO0001 Reserved for inherently not codable concepts without codable children: Secondary | ICD-10-CM | POA: Diagnosis present

## 2014-02-11 DIAGNOSIS — Z87442 Personal history of urinary calculi: Secondary | ICD-10-CM

## 2014-02-11 DIAGNOSIS — S32309A Unspecified fracture of unspecified ilium, initial encounter for closed fracture: Secondary | ICD-10-CM | POA: Diagnosis present

## 2014-02-11 DIAGNOSIS — R142 Eructation: Secondary | ICD-10-CM

## 2014-02-11 DIAGNOSIS — K219 Gastro-esophageal reflux disease without esophagitis: Secondary | ICD-10-CM | POA: Diagnosis present

## 2014-02-11 DIAGNOSIS — F411 Generalized anxiety disorder: Secondary | ICD-10-CM | POA: Diagnosis present

## 2014-02-11 DIAGNOSIS — W138XXA Fall from, out of or through other building or structure, initial encounter: Secondary | ICD-10-CM | POA: Diagnosis present

## 2014-02-11 DIAGNOSIS — K9189 Other postprocedural complications and disorders of digestive system: Secondary | ICD-10-CM

## 2014-02-11 DIAGNOSIS — K567 Ileus, unspecified: Secondary | ICD-10-CM | POA: Diagnosis not present

## 2014-02-11 DIAGNOSIS — S329XXA Fracture of unspecified parts of lumbosacral spine and pelvis, initial encounter for closed fracture: Secondary | ICD-10-CM | POA: Diagnosis present

## 2014-02-11 DIAGNOSIS — S060XAA Concussion with loss of consciousness status unknown, initial encounter: Secondary | ICD-10-CM | POA: Diagnosis present

## 2014-02-11 HISTORY — DX: Gout, unspecified: M10.9

## 2014-02-11 HISTORY — DX: Disorder of thyroid, unspecified: E07.9

## 2014-02-11 LAB — CBC
HCT: 41.3 % (ref 39.0–52.0)
Hemoglobin: 14 g/dL (ref 13.0–17.0)
MCH: 30.1 pg (ref 26.0–34.0)
MCHC: 33.9 g/dL (ref 30.0–36.0)
MCV: 88.8 fL (ref 78.0–100.0)
PLATELETS: 187 10*3/uL (ref 150–400)
RBC: 4.65 MIL/uL (ref 4.22–5.81)
RDW: 13.3 % (ref 11.5–15.5)
WBC: 7.7 10*3/uL (ref 4.0–10.5)

## 2014-02-11 LAB — COMPREHENSIVE METABOLIC PANEL
ALBUMIN: 3.8 g/dL (ref 3.5–5.2)
ALT: 27 U/L (ref 0–53)
AST: 24 U/L (ref 0–37)
Alkaline Phosphatase: 56 U/L (ref 39–117)
Anion gap: 12 (ref 5–15)
BUN: 19 mg/dL (ref 6–23)
CHLORIDE: 109 meq/L (ref 96–112)
CO2: 22 meq/L (ref 19–32)
Calcium: 9.1 mg/dL (ref 8.4–10.5)
Creatinine, Ser: 1.15 mg/dL (ref 0.50–1.35)
GFR calc Af Amer: 80 mL/min — ABNORMAL LOW (ref >90)
GFR, EST NON AFRICAN AMERICAN: 69 mL/min — AB (ref >90)
Glucose, Bld: 109 mg/dL — ABNORMAL HIGH (ref 70–99)
Potassium: 4.5 mEq/L (ref 3.7–5.3)
SODIUM: 143 meq/L (ref 137–147)
Total Bilirubin: 0.3 mg/dL (ref 0.3–1.2)
Total Protein: 6.9 g/dL (ref 6.0–8.3)

## 2014-02-11 MED ORDER — POTASSIUM CHLORIDE IN NACL 20-0.45 MEQ/L-% IV SOLN
INTRAVENOUS | Status: DC
  Administered 2014-02-11 – 2014-02-12 (×2): via INTRAVENOUS
  Filled 2014-02-11 (×5): qty 1000

## 2014-02-11 MED ORDER — PANTOPRAZOLE SODIUM 40 MG IV SOLR
40.0000 mg | Freq: Every day | INTRAVENOUS | Status: DC
  Filled 2014-02-11 (×2): qty 40

## 2014-02-11 MED ORDER — POLYETHYLENE GLYCOL 3350 17 G PO PACK
17.0000 g | PACK | Freq: Every day | ORAL | Status: DC
  Administered 2014-02-13 – 2014-02-17 (×5): 17 g via ORAL
  Filled 2014-02-11 (×8): qty 1

## 2014-02-11 MED ORDER — DOCUSATE SODIUM 100 MG PO CAPS
100.0000 mg | ORAL_CAPSULE | Freq: Two times a day (BID) | ORAL | Status: DC
  Administered 2014-02-12 – 2014-02-13 (×2): 100 mg via ORAL
  Filled 2014-02-11 (×4): qty 1

## 2014-02-11 MED ORDER — PANTOPRAZOLE SODIUM 40 MG PO TBEC
40.0000 mg | DELAYED_RELEASE_TABLET | Freq: Every day | ORAL | Status: DC
Start: 2014-02-11 — End: 2014-02-17
  Administered 2014-02-11 – 2014-02-17 (×6): 40 mg via ORAL
  Filled 2014-02-11 (×6): qty 1

## 2014-02-11 MED ORDER — ALLOPURINOL 300 MG PO TABS
300.0000 mg | ORAL_TABLET | Freq: Every day | ORAL | Status: DC
  Administered 2014-02-13 – 2014-02-17 (×5): 300 mg via ORAL
  Filled 2014-02-11 (×7): qty 1

## 2014-02-11 MED ORDER — CEFAZOLIN SODIUM-DEXTROSE 2-3 GM-% IV SOLR
2.0000 g | Freq: Once | INTRAVENOUS | Status: AC
  Administered 2014-02-12: 2 g via INTRAVENOUS
  Filled 2014-02-11: qty 50

## 2014-02-11 MED ORDER — ENOXAPARIN SODIUM 30 MG/0.3ML ~~LOC~~ SOLN
30.0000 mg | Freq: Two times a day (BID) | SUBCUTANEOUS | Status: DC
  Administered 2014-02-12 – 2014-02-17 (×10): 30 mg via SUBCUTANEOUS
  Filled 2014-02-11 (×13): qty 0.3

## 2014-02-11 MED ORDER — ONDANSETRON HCL 4 MG/2ML IJ SOLN
4.0000 mg | Freq: Four times a day (QID) | INTRAMUSCULAR | Status: DC | PRN
  Administered 2014-02-13: 4 mg via INTRAVENOUS
  Filled 2014-02-11: qty 2

## 2014-02-11 MED ORDER — LEVOTHYROXINE SODIUM 88 MCG PO TABS
88.0000 ug | ORAL_TABLET | Freq: Every day | ORAL | Status: DC
  Administered 2014-02-13 – 2014-02-17 (×5): 88 ug via ORAL
  Filled 2014-02-11 (×7): qty 1

## 2014-02-11 MED ORDER — HYDROMORPHONE HCL PF 1 MG/ML IJ SOLN
1.0000 mg | Freq: Once | INTRAMUSCULAR | Status: AC
  Administered 2014-02-11: 1 mg via INTRAVENOUS
  Filled 2014-02-11: qty 1

## 2014-02-11 MED ORDER — MORPHINE SULFATE 2 MG/ML IJ SOLN
2.0000 mg | INTRAMUSCULAR | Status: DC | PRN
  Administered 2014-02-11 – 2014-02-14 (×7): 2 mg via INTRAVENOUS
  Filled 2014-02-11 (×7): qty 1

## 2014-02-11 MED ORDER — ONDANSETRON HCL 4 MG PO TABS
4.0000 mg | ORAL_TABLET | Freq: Four times a day (QID) | ORAL | Status: DC | PRN
  Administered 2014-02-14 – 2014-02-15 (×3): 4 mg via ORAL
  Filled 2014-02-11 (×3): qty 1

## 2014-02-11 MED ORDER — OXYCODONE HCL 5 MG PO TABS
5.0000 mg | ORAL_TABLET | ORAL | Status: DC | PRN
  Administered 2014-02-11 – 2014-02-13 (×2): 15 mg via ORAL
  Administered 2014-02-13: 10 mg via ORAL
  Administered 2014-02-13 – 2014-02-14 (×4): 15 mg via ORAL
  Filled 2014-02-11: qty 2
  Filled 2014-02-11: qty 3
  Filled 2014-02-11: qty 2
  Filled 2014-02-11 (×5): qty 3

## 2014-02-11 MED ORDER — SODIUM CHLORIDE 0.9 % IV BOLUS (SEPSIS)
500.0000 mL | Freq: Once | INTRAVENOUS | Status: AC
  Administered 2014-02-11: 500 mL via INTRAVENOUS

## 2014-02-11 MED ORDER — IOHEXOL 300 MG/ML  SOLN
80.0000 mL | Freq: Once | INTRAMUSCULAR | Status: AC | PRN
  Administered 2014-02-11: 80 mL via INTRAVENOUS

## 2014-02-11 MED ORDER — IOHEXOL 350 MG/ML SOLN
50.0000 mL | Freq: Once | INTRAVENOUS | Status: AC | PRN
  Administered 2014-02-11: 20 mL via URETHRAL

## 2014-02-11 NOTE — ED Notes (Signed)
Dr. Gildardo Griffesttman is coming to see the pt. Requesting for the urology cart to be at bedside.

## 2014-02-11 NOTE — ED Notes (Signed)
OR called and is going to bring the OR cart down to ED.

## 2014-02-11 NOTE — ED Notes (Signed)
Ortho at bedside.

## 2014-02-11 NOTE — ED Notes (Signed)
Called xray, sts the pt has to come out of traction and go onto their table in order to perform the scan. Ortho tech took weights off.

## 2014-02-11 NOTE — Progress Notes (Signed)
Orthopedic Tech Progress Note Patient Details:  Edwin Schneider 1956-03-26 [id number] Skeletal tx applied in ED. Nurse to call when patient is transferred to room so tx equipment can be switched to room bed and reapplied. Musculoskeletal Traction Type of Traction: Skeletal (Balanced Suspension) Traction Location: LLE Traction Weight: 15 lbs    [name] 02/11/2014, 3:38 PM

## 2014-02-11 NOTE — H&P (Signed)
Seen and agree. Discussed at bedside with Dr. Roda ShuttersXu. Will obtain RUG. Admit to SDU. Patient examined and I agree with the assessment and plan  Violeta GelinasBurke Sihaam Chrobak, MD, MPH, FACS Trauma: [phone] General Surgery: [phone]  02/11/2014 2:44 PM

## 2014-02-11 NOTE — ED Notes (Signed)
Report attempted x 1

## 2014-02-11 NOTE — ED Notes (Signed)
Per EMS - pt fell off a roof, approx 10-12 ft, pt was getting off on the ladder when the ladder slipped and the pt fell. Pt c/o left hip pain, pt reports when straightening leg out the pain is unbearable. Pt had good distal pulses. ems administered 50 mcg of fentanyl. Denies neck and back pain. ems placed pt in c-collar and on back board. BP 144/94 HR 76. 18 G in left AC.

## 2014-02-11 NOTE — H&P (Signed)
Edwin Schneider is an 58 y.o. male.   Chief Complaint: Fall HPI: [name] was on his roof and was about to climb down when the extension piece of his extension ladder gave way and he fell, striking his buttocks and back. He admitted to a short loss of consciousness but denies amnesia to the event. He was unable to move afterwards secondary to pain. He was brought to [facility] and was not a trauma activation.   Past Medical History  Diagnosis Date  . Gout   . Thyroid disease     Past Surgical History  Procedure Laterality Date  . Knee surgery      No family history on file. Social History:  reports that he has never smoked. He does not have any smokeless tobacco history on file. He reports that he does not drink alcohol or use illicit drugs.  Allergies: No Known Allergies   Results for orders placed during the hospital encounter of 02/11/14 (from the past 48 hour(s))  CBC     Status: None   Collection Time    02/11/14 11:36 AM      Result Value Ref Range   WBC 7.7  4.0 - 10.5 K/uL   RBC 4.65  4.22 - 5.81 MIL/uL   Hemoglobin 14.0  13.0 - 17.0 g/dL   HCT 41.3  39.0 - 52.0 %   MCV 88.8  78.0 - 100.0 fL   MCH 30.1  26.0 - 34.0 pg   MCHC 33.9  30.0 - 36.0 g/dL   RDW 13.3  11.5 - 15.5 %   Platelets 187  150 - 400 K/uL  COMPREHENSIVE METABOLIC PANEL     Status: Abnormal   Collection Time    02/11/14 11:36 AM      Result Value Ref Range   Sodium 143  137 - 147 mEq/L   Potassium 4.5  3.7 - 5.3 mEq/L   Chloride 109  96 - 112 mEq/L   CO2 22  19 - 32 mEq/L   Glucose, Bld 109 (*) 70 - 99 mg/dL   BUN 19  6 - 23 mg/dL   Creatinine, Ser 1.15  0.50 - 1.35 mg/dL   Calcium 9.1  8.4 - 10.5 mg/dL   Total Protein 6.9  6.0 - 8.3 g/dL   Albumin 3.8  3.5 - 5.2 g/dL   AST 24  0 - 37 U/L   ALT 27  0 - 53 U/L   Alkaline Phosphatase 56  39 - 117 U/L   Total Bilirubin 0.3  0.3 - 1.2 mg/dL   GFR calc non Af Amer 69 (*) >90 mL/min   GFR calc Af Amer 80 (*) >90 mL/min   Comment: (NOTE)     The  eGFR has been calculated using the CKD EPI equation.     This calculation has not been validated in all clinical situations.     eGFR's persistently <90 mL/min signify possible Chronic Kidney     Disease.   Anion gap 12  5 - 15   Ct Head Wo Contrast  02/11/2014   CLINICAL DATA:  Trauma secondary to fall off of a ladder 10-12 feet.  EXAM: CT HEAD WITHOUT CONTRAST  CT CERVICAL SPINE WITHOUT CONTRAST  TECHNIQUE: Multidetector CT imaging of the head and cervical spine was performed following the standard protocol without intravenous contrast. Multiplanar CT image reconstructions of the cervical spine were also generated.  COMPARISON:  None.  FINDINGS: CT HEAD FINDINGS  No mass lesion. No  midline shift. No acute hemorrhage or hematoma. No extra-axial fluid collections. No evidence of acute infarction. Brain parenchyma is normal. Osseous structures are normal.  CT CERVICAL SPINE FINDINGS  There is no fracture, subluxation, or prevertebral soft tissue swelling. There is degenerative disc disease at C5-6 and C6-7 with prominent calcification in the posterior longitudinal ligament at those levels. No significant facet arthritis. There is less prominent degenerative disc disease at C4-5 and C3-4.  IMPRESSION: 1. Normal CT scan of the head. 2. No acute abnormality of the cervical spine.   Electronically Signed   By: Rozetta Nunnery M.D.   On: 02/11/2014 12:37   Ct Abdomen Pelvis W Contrast  02/11/2014   CLINICAL DATA:  [place] from roof. Pelvic displacement on standard radiographs.  EXAM: CT ABDOMEN AND PELVIS WITH CONTRAST  TECHNIQUE: Multidetector CT imaging of the abdomen and pelvis was performed using the standard protocol following bolus administration of intravenous contrast.  CONTRAST:  62mL OMNIPAQUE IOHEXOL 300 MG/ML  SOLN  COMPARISON:  Current pelvis radiographs  FINDINGS: The left SI joint is distracted. It is separated by 7 mm with the ileum translating superiorly in relation to the sacrum by 13 mm. The pubic  symphysis is widened by 2.3 cm, with the left pubic symphysis displacing superiorly in relation to the same degree as the left ilium in relation to the sacrum.  There are small fractures. There is a small fracture from the posterior medial margin of the left ischium at the level of the mid acetabulum. There is a small fracture from the inferior margin of the ileum along the posterior inferior articular margin of the SI joint. Several very small bone fragments are noted along the superior margin of the distracted left SI joint. There are fractures of the lateral tips of the spinous processes of L4 and L5.  No other pelvic fractures. No fracture of the proximal femurs. No vertebral body fractures.  Lung bases are essentially clear. Liver, spleen, gallbladder, pancreas, adrenal glands: Unremarkable.  Small low-density renal lesions are noted, most likely cysts. There is mild renal cortical thinning, greater on the left. No hydronephrosis. Normal ureters.  The bladder is unremarkable. There is stranding consistent with hemorrhage/edema in the extraperitoneal space adjacent to the anterior inferior bladder and tracking along the anterior and superior margin of the left SI joint.  No adenopathy. No ascites. No evidence of a bowel or mesenteric injury.  IMPRESSION: 1. Disruption of the left SI joint and symphysis pubis with distraction of the left hemipelvis, displacing laterally and superiorly as detailed above. There are several small fracture fragments adjacent to the SI joint and along the posterior ischium at the mid level of the left acetabulum. There are also fractures of the lateral tips of the left spinous processes of L4 and L5. No other fractures. 2. No evidence of injury to the bladder. 3. No other acute findings.   Electronically Signed   By: Lajean Manes M.D.   On: 02/11/2014 12:44   Dg Pelvis Portable  02/11/2014   CLINICAL DATA:  Fall.  Left hip pain.  EXAM: PORTABLE PELVIS 1-2 VIEWS  COMPARISON:  None.   FINDINGS: Left SI joint is widened. The ileum has translated 11 mm superiorly in relation to the sacrum. The symphysis pubis is widened to 27 mm. There is also malalignment with the left symphysis higher than the right. There is a small bone fragment adjacent to the inferior margin of the sacrum at the SI joint, which could reflect a  phlebolith or small corner fracture from the displaced ileum.  No other evidence of a fracture. Right SI joint and both hip joints are normally spaced and aligned. No bone lesion.  IMPRESSION: 1. Disrupted left SI joint and symphysis pubis. The left hemipelvis is displaced superiorly and laterally as detailed above. Possible small corner fracture from the inferior margin of the ileum at the SI joint. No other evidence of a fracture.   Electronically Signed   By: Lajean Manes M.D.   On: 02/11/2014 12:06   Dg Chest Port 1 View  02/11/2014   CLINICAL DATA:  Fall.  Left chest pain.  EXAM: PORTABLE CHEST - 1 VIEW  COMPARISON:  11/19/2011  FINDINGS: Heart, mediastinum hila are unremarkable.  Lungs are clear and are symmetrically aerated. No pleural effusion or pneumothorax.  The bony thorax is intact.  IMPRESSION: No active disease.   Electronically Signed   By: Lajean Manes M.D.   On: 02/11/2014 12:03   Dg Femur Left Port  02/11/2014   CLINICAL DATA:  Left hip pain secondary to a fall.  EXAM: PORTABLE LEFT FEMUR - 2 VIEW  COMPARISON:  Pelvic radiograph dated 02/11/2014  FINDINGS: The left femur is intact. There is no dislocation. There is marked diastases of the symphysis pubis and of the left sacroiliac joint.  IMPRESSION: Normal left femur.   Electronically Signed   By: Rozetta Nunnery M.D.   On: 02/11/2014 13:10    Review of Systems  Constitutional: Negative for weight loss.  HENT: Negative for ear discharge, ear pain, hearing loss and tinnitus.   Eyes: Negative for blurred vision, double vision, photophobia and pain.  Respiratory: Negative for cough, sputum production and  shortness of breath.   Cardiovascular: Negative for chest pain.  Gastrointestinal: Negative for nausea, vomiting and abdominal pain.  Genitourinary: Positive for dysuria. Negative for urgency, frequency and flank pain.  Musculoskeletal: Positive for back pain. Negative for falls, joint pain, myalgias and neck pain.  Neurological: Positive for dizziness, sensory change and loss of consciousness. Negative for tingling, focal weakness and headaches.  Endo/Heme/Allergies: Does not bruise/bleed easily.  Psychiatric/Behavioral: Negative for depression, memory loss and substance abuse. The patient is not nervous/anxious.     Blood pressure 145/94, pulse 66, temperature 98.2 F (36.8 C), temperature source Oral, resp. rate 17, SpO2 97.00%. Physical Exam  Vitals reviewed. Constitutional: He is oriented to person, place, and time. He appears well-developed and well-nourished. He is cooperative. No distress. Cervical collar and nasal cannula in place.  HENT:  Head: Normocephalic and atraumatic. Head is without raccoon's eyes, without Battle's sign, without abrasion, without contusion and without laceration.  Right Ear: Hearing, tympanic membrane, external ear and ear canal normal. No lacerations. No drainage or tenderness. No foreign bodies. Tympanic membrane is not perforated. No hemotympanum.  Left Ear: Hearing, tympanic membrane, external ear and ear canal normal. No lacerations. No drainage or tenderness. No foreign bodies. Tympanic membrane is not perforated. No hemotympanum.  Nose: Nose normal. No nose lacerations, sinus tenderness, nasal deformity or nasal septal hematoma. No epistaxis.  Mouth/Throat: Uvula is midline, oropharynx is clear and moist and mucous membranes are normal. No lacerations. No oropharyngeal exudate.  Eyes: Conjunctivae, EOM and lids are normal. Pupils are equal, round, and reactive to light. Right eye exhibits no discharge. Left eye exhibits no discharge. No scleral icterus.   Neck: Trachea normal and normal range of motion. Neck supple. No JVD present. No spinous process tenderness and no muscular tenderness present. Carotid bruit  is not present. No tracheal deviation present. No thyromegaly present.  Cardiovascular: Normal rate, regular rhythm, normal heart sounds, intact distal pulses and normal pulses.  Exam reveals no gallop and no friction rub.   No murmur heard. Respiratory: Effort normal and breath sounds normal. No stridor. No respiratory distress. He has no wheezes. He has no rales. He exhibits no tenderness, no bony tenderness, no laceration and no crepitus.  GI: Soft. Normal appearance. He exhibits no distension. Bowel sounds are decreased. There is no tenderness. There is no rigidity, no rebound, no guarding and no CVA tenderness.  Genitourinary: Penis normal.  Musculoskeletal: Normal range of motion. He exhibits no edema and no tenderness.  Lymphadenopathy:    He has no cervical adenopathy.  Neurological: He is alert and oriented to person, place, and time. He has normal strength. No cranial nerve deficit or sensory deficit. GCS eye subscore is 4. GCS verbal subscore is 5. GCS motor subscore is 6.  Skin: Skin is warm, dry and intact. He is not diaphoretic.  Psychiatric: He has a normal mood and affect. His speech is normal and behavior is normal.     Assessment/Plan Fall Concussion Pelvic fxs/dislocation -- Dr. Erlinda Hong placing a traction pin and then plans to transfer care to Dr. Marcelino Scot. Will get RUG with mechanism, injury pattern, and inability to urinate.  Admit to trauma to SDU.     Lisette Abu, PA-C Pager: [phone] General Trauma PA Pager: [phone] 02/11/2014, 1:30 PM

## 2014-02-11 NOTE — ED Notes (Signed)
Dr. Roda ShuttersXu performed procedure, pt tolerated it well.

## 2014-02-11 NOTE — Consult Note (Signed)
ORTHOPAEDIC CONSULTATION  REQUESTING PHYSICIAN: Elwin Mocha, MD  Chief Complaint: Pelvic injury  HPI: MCCRAE SPECIALE is a 58 y.o. male who complains of pelvic injury s/p fall from roof.  Denies LOC, neck pain, abd pain.  Ortho consulted for pelvic ring injury.  Past Medical History  Diagnosis Date  . Gout   . Thyroid disease    Past Surgical History  Procedure Laterality Date  . Knee surgery     History   Social History  . Marital Status: Married    Spouse Name: N/A    Number of Children: N/A  . Years of Education: N/A   Social History Main Topics  . Smoking status: Never Smoker   . Smokeless tobacco: None  . Alcohol Use: No  . Drug Use: No  . Sexual Activity: None   Other Topics Concern  . None   Social History Narrative  . None   No family history on file. No Known Allergies Prior to Admission medications   Medication Sig Start Date End Date Taking? Authorizing Provider  allopurinol (ZYLOPRIM) 300 MG tablet Take 300 mg by mouth daily.   Yes Historical Provider, MD  levothyroxine (SYNTHROID, LEVOTHROID) 88 MCG tablet Take 88 mcg by mouth daily before breakfast.   Yes Historical Provider, MD   Ct Head Wo Contrast  02/11/2014   CLINICAL DATA:  Trauma secondary to fall off of a ladder 10-12 feet.  EXAM: CT HEAD WITHOUT CONTRAST  CT CERVICAL SPINE WITHOUT CONTRAST  TECHNIQUE: Multidetector CT imaging of the head and cervical spine was performed following the standard protocol without intravenous contrast. Multiplanar CT image reconstructions of the cervical spine were also generated.  COMPARISON:  None.  FINDINGS: CT HEAD FINDINGS  No mass lesion. No midline shift. No acute hemorrhage or hematoma. No extra-axial fluid collections. No evidence of acute infarction. Brain parenchyma is normal. Osseous structures are normal.  CT CERVICAL SPINE FINDINGS  There is no fracture, subluxation, or prevertebral soft tissue swelling. There is degenerative disc disease at C5-6  and C6-7 with prominent calcification in the posterior longitudinal ligament at those levels. No significant facet arthritis. There is less prominent degenerative disc disease at C4-5 and C3-4.  IMPRESSION: 1. Normal CT scan of the head. 2. No acute abnormality of the cervical spine.   Electronically Signed   By: Geanie Cooley M.D.   On: 02/11/2014 12:37   Ct Cervical Spine Wo Contrast  02/11/2014   CLINICAL DATA:  Trauma secondary to fall off of a ladder 10-12 feet.  EXAM: CT HEAD WITHOUT CONTRAST  CT CERVICAL SPINE WITHOUT CONTRAST  TECHNIQUE: Multidetector CT imaging of the head and cervical spine was performed following the standard protocol without intravenous contrast. Multiplanar CT image reconstructions of the cervical spine were also generated.  COMPARISON:  None.  FINDINGS: CT HEAD FINDINGS  No mass lesion. No midline shift. No acute hemorrhage or hematoma. No extra-axial fluid collections. No evidence of acute infarction. Brain parenchyma is normal. Osseous structures are normal.  CT CERVICAL SPINE FINDINGS  There is no fracture, subluxation, or prevertebral soft tissue swelling. There is degenerative disc disease at C5-6 and C6-7 with prominent calcification in the posterior longitudinal ligament at those levels. No significant facet arthritis. There is less prominent degenerative disc disease at C4-5 and C3-4.  IMPRESSION: 1. Normal CT scan of the head. 2. No acute abnormality of the cervical spine.   Electronically Signed   By: Geanie Cooley M.D.   On: 02/11/2014 12:37  Ct Abdomen Pelvis W Contrast  02/11/2014   CLINICAL DATA:  [place] Seat from roof. Pelvic displacement on standard radiographs.  EXAM: CT ABDOMEN AND PELVIS WITH CONTRAST  TECHNIQUE: Multidetector CT imaging of the abdomen and pelvis was performed using the standard protocol following bolus administration of intravenous contrast.  CONTRAST:  80mL OMNIPAQUE IOHEXOL 300 MG/ML  SOLN  COMPARISON:  Current pelvis radiographs  FINDINGS: The left  SI joint is distracted. It is separated by 7 mm with the ileum translating superiorly in relation to the sacrum by 13 mm. The pubic symphysis is widened by 2.3 cm, with the left pubic symphysis displacing superiorly in relation to the same degree as the left ilium in relation to the sacrum.  There are small fractures. There is a small fracture from the posterior medial margin of the left ischium at the level of the mid acetabulum. There is a small fracture from the inferior margin of the ileum along the posterior inferior articular margin of the SI joint. Several very small bone fragments are noted along the superior margin of the distracted left SI joint. There are fractures of the lateral tips of the spinous processes of L4 and L5.  No other pelvic fractures. No fracture of the proximal femurs. No vertebral body fractures.  Lung bases are essentially clear. Liver, spleen, gallbladder, pancreas, adrenal glands: Unremarkable.  Small low-density renal lesions are noted, most likely cysts. There is mild renal cortical thinning, greater on the left. No hydronephrosis. Normal ureters.  The bladder is unremarkable. There is stranding consistent with hemorrhage/edema in the extraperitoneal space adjacent to the anterior inferior bladder and tracking along the anterior and superior margin of the left SI joint.  No adenopathy. No ascites. No evidence of a bowel or mesenteric injury.  IMPRESSION: 1. Disruption of the left SI joint and symphysis pubis with distraction of the left hemipelvis, displacing laterally and superiorly as detailed above. There are several small fracture fragments adjacent to the SI joint and along the posterior ischium at the mid level of the left acetabulum. There are also fractures of the lateral tips of the left spinous processes of L4 and L5. No other fractures. 2. No evidence of injury to the bladder. 3. No other acute findings.   Electronically Signed   By: Amie Portland M.D.   On: 02/11/2014  12:44   Dg Pelvis Portable  02/11/2014   CLINICAL DATA:  Fall.  Left hip pain.  EXAM: PORTABLE PELVIS 1-2 VIEWS  COMPARISON:  None.  FINDINGS: Left SI joint is widened. The ileum has translated 11 mm superiorly in relation to the sacrum. The symphysis pubis is widened to 27 mm. There is also malalignment with the left symphysis higher than the right. There is a small bone fragment adjacent to the inferior margin of the sacrum at the SI joint, which could reflect a phlebolith or small corner fracture from the displaced ileum.  No other evidence of a fracture. Right SI joint and both hip joints are normally spaced and aligned. No bone lesion.  IMPRESSION: 1. Disrupted left SI joint and symphysis pubis. The left hemipelvis is displaced superiorly and laterally as detailed above. Possible small corner fracture from the inferior margin of the ileum at the SI joint. No other evidence of a fracture.   Electronically Signed   By: Amie Portland M.D.   On: 02/11/2014 12:06   Dg Chest Port 1 View  02/11/2014   CLINICAL DATA:  Fall.  Left chest pain.  EXAM:  PORTABLE CHEST - 1 VIEW  COMPARISON:  11/19/2011  FINDINGS: Heart, mediastinum hila are unremarkable.  Lungs are clear and are symmetrically aerated. No pleural effusion or pneumothorax.  The bony thorax is intact.  IMPRESSION: No active disease.   Electronically Signed   By: Amie Portlandavid  Ormond M.D.   On: 02/11/2014 12:03   Dg Femur Left Port  02/11/2014   CLINICAL DATA:  Left hip pain secondary to a fall.  EXAM: PORTABLE LEFT FEMUR - 2 VIEW  COMPARISON:  Pelvic radiograph dated 02/11/2014  FINDINGS: The left femur is intact. There is no dislocation. There is marked diastases of the symphysis pubis and of the left sacroiliac joint.  IMPRESSION: Normal left femur.   Electronically Signed   By: Geanie CooleyJim  Maxwell M.D.   On: 02/11/2014 13:10    Positive ROS: All other systems have been reviewed and were otherwise negative with the exception of those mentioned in the HPI and as  above.  Physical Exam: General: Alert, no acute distress Cardiovascular: No pedal edema Respiratory: No cyanosis, no use of accessory musculature GI: No organomegaly, abdomen is soft and non-tender Skin: No lesions in the area of chief complaint Neurologic: Sensation intact distally Psychiatric: Patient is competent for consent with normal mood and affect Lymphatic: No axillary or cervical lymphadenopathy  MUSCULOSKELETAL:  - left hemipelvis TTP - NVI LLE - superficial abrasion to right knee  Assessment: Left vertical shear, APC2 pelvic ring injury  Plan: - femoral skeletal traction placed in ER - pelvic sheet placed - will need formal fixation of pelvis - will d/w Dr. Carola FrostHandy - NWB LLE - admit to trauma for concussion and 1 episode of hypotension  Thank you for the consult and the opportunity to see Mr. Eusebio FriendlyCastaneda  N. Glee ArvinMichael Jaycey Gens, MD [facility] [phone] 1:47 PM

## 2014-02-11 NOTE — ED Notes (Signed)
Dr. Gwendolyn GrantWalden at bedside, took hip binder off and pressed on pelvis. Pelvis stable. Pt c/o pain to posterior left hip.

## 2014-02-11 NOTE — ED Provider Notes (Signed)
CSN: [id number]     Arrival date & time 02/11/14  1104 History   First MD Initiated Contact with Patient 02/11/14 1107     Chief Complaint  Patient presents with  . Fall  . Hip Pain     (Consider location/radiation/quality/duration/timing/severity/associated sxs/prior Treatment) HPI Comments: Larey Seat off a roof. Landed on L hip. No head injury or LOC.  Patient is a 58 y.o. male presenting with fall and hip pain. The history is provided by the patient.  Fall This is a new problem. The current episode started less than 1 hour ago. The problem occurs constantly. The problem has not changed since onset.Pertinent negatives include no abdominal pain and no shortness of breath. Nothing aggravates the symptoms. Nothing relieves the symptoms.  Hip Pain Pertinent negatives include no abdominal pain and no shortness of breath.    Past Medical History  Diagnosis Date  . Gout   . Thyroid disease    Past Surgical History  Procedure Laterality Date  . Knee surgery     No family history on file. History  Substance Use Topics  . Smoking status: Never Smoker   . Smokeless tobacco: Not on file  . Alcohol Use: No    Review of Systems  Constitutional: Negative for fever and chills.  Respiratory: Negative for cough and shortness of breath.   Gastrointestinal: Negative for vomiting and abdominal pain.  All other systems reviewed and are negative.     Allergies  Review of patient's allergies indicates no known allergies.  Home Medications   Prior to Admission medications   Medication Sig Start Date End Date Taking? Authorizing Provider  allopurinol (ZYLOPRIM) 300 MG tablet TAKE ONE TABLET BY MOUTH TWICE DAILY 12/25/10   Grayling Congress Lowne, DO   BP 142/100  Pulse 49  Temp(Src) 98.2 F (36.8 C) (Oral)  Resp 15  SpO2 100% Physical Exam  Nursing note and vitals reviewed. Constitutional: He is oriented to person, place, and time. He appears well-developed and well-nourished. No distress.   HENT:  Head: Normocephalic and atraumatic.  Mouth/Throat: Oropharynx is clear and moist. No oropharyngeal exudate.  Eyes: EOM are normal. Pupils are equal, round, and reactive to light.  Neck: Normal range of motion. Neck supple.  Cardiovascular: Normal rate and regular rhythm.  Exam reveals no friction rub.   No murmur heard. Pulmonary/Chest: Effort normal and breath sounds normal. No respiratory distress. He has no wheezes. He has no rales.  Abdominal: Soft. He exhibits no distension. There is no tenderness. There is no rebound.  Musculoskeletal: He exhibits no edema.       Left hip: He exhibits decreased range of motion, tenderness and bony tenderness. He exhibits no deformity and no laceration.  Neurological: He is alert and oriented to person, place, and time.  Skin: No rash noted. He is not diaphoretic.    ED Course  Procedures (including critical care time) Labs Review Labs Reviewed  CBC  COMPREHENSIVE METABOLIC PANEL    Imaging Review Dg Pelvis Portable  02/11/2014   CLINICAL DATA:  Fall.  Left hip pain.  EXAM: PORTABLE PELVIS 1-2 VIEWS  COMPARISON:  None.  FINDINGS: Left SI joint is widened. The ileum has translated 11 mm superiorly in relation to the sacrum. The symphysis pubis is widened to 27 mm. There is also malalignment with the left symphysis higher than the right. There is a small bone fragment adjacent to the inferior margin of the sacrum at the SI joint, which could reflect a phlebolith or  small corner fracture from the displaced ileum.  No other evidence of a fracture. Right SI joint and both hip joints are normally spaced and aligned. No bone lesion.  IMPRESSION: 1. Disrupted left SI joint and symphysis pubis. The left hemipelvis is displaced superiorly and laterally as detailed above. Possible small corner fracture from the inferior margin of the ileum at the SI joint. No other evidence of a fracture.   Electronically Signed   By: Amie Portlandavid  Ormond M.D.   On: 02/11/2014  12:06   Dg Chest Port 1 View  02/11/2014   CLINICAL DATA:  Fall.  Left chest pain.  EXAM: PORTABLE CHEST - 1 VIEW  COMPARISON:  11/19/2011  FINDINGS: Heart, mediastinum hila are unremarkable.  Lungs are clear and are symmetrically aerated. No pleural effusion or pneumothorax.  The bony thorax is intact.  IMPRESSION: No active disease.   Electronically Signed   By: Amie Portlandavid  Ormond M.D.   On: 02/11/2014 12:03     EKG Interpretation None      MDM   Final diagnoses:  Closed fracture of left pelvis, initial encounter  Fall, initial encounter    61M here after a fall - fell off of a roof roughly 10-12 feet. Landed on L hip. No LOC. Here with stable vitals. Lungs clear, abdomen without tenderness. L hip pain with ROM. Pelvis stable for me.  CT of Head and Neck ordered. CT Abd/pelvis ordered. Portable pelvis xray with substantial L pelvic fracture with disruption of the symphysis. I consulted Trauma and Ortho while patient in the CT scanner.  Dr. Roda ShuttersXu with Ortho will see patient. Trauma admitting.  I have reviewed all labs and imaging and considered them in my medical decision making.   Edwin MochaBlair Hala Narula, MD 02/11/14 1540

## 2014-02-11 NOTE — ED Notes (Addendum)
c-collar taken off by trauma PA.

## 2014-02-11 NOTE — Consult Note (Signed)
Urology Consult   Physician requesting consult: Janee Morn  Reason for consult: Urinary retention due to pelvic trauma  History of Present Illness: Edwin Schneider is a 58 y.o. hispanic male with PMH significant for gout and thyroid disease who fell off of his roof earlier today. He landed on his buttocks and back and was unable to move afterwards due to pain.  He was brought to [facility] ED for eval.  CT A/P reveals a disruption of the left SI joint and symphysis pubis with detraction of the left hemipelvis, left L4 and L5 spinous process fractures, and no obvious bladder injury.  Dg Retrograde-urethrogram shows tapering and abrupt narrowing of the urethra in the region of the proximal bulbar urethra. No extravasation of contrast to suggest urethral transection. Findings are suggestive of either a severe stretch injury, extrinsic compression of the proximal bulbar urethra from trauma, and/or severe spasm of the external urethral sphincter.  Pt is resting comfortably.  He speaks broken english.  He denies the urge to void although I am not certain he understands the question.  He tried to void earlier but was unable.  He denies a history of voiding or storage urinary symptoms, hematuria, UTIs, and GU malignancy/trauma/surgery.  He states he passed kidney stones many years ago.  He currently denies HA, CP, SOB, and N/V.  Past Medical History  Diagnosis Date  . Gout   . Thyroid disease     Past Surgical History  Procedure Laterality Date  . Knee surgery      Current Hospital Medications:  Home Meds:    Medication List    ASK your doctor about these medications       allopurinol 300 MG tablet  Commonly known as:  ZYLOPRIM  Take 300 mg by mouth daily.     levothyroxine 88 MCG tablet  Commonly known as:  SYNTHROID, LEVOTHROID  Take 88 mcg by mouth daily before breakfast.        Allergies: No Known Allergies  No family history on file.  Social History:  reports that he has never  smoked. He does not have any smokeless tobacco history on file. He reports that he does not drink alcohol or use illicit drugs.  ROS: A complete review of systems was performed.  All systems are negative except for pertinent findings as noted.  Physical Exam:  Vital signs in last 24 hours: Temp:  [98.2 F (36.8 C)] 98.2 F (36.8 C) (08/06 1117) Pulse Rate:  [49-71] 70 (08/06 1500) Resp:  [12-21] 12 (08/06 1500) BP: (131-146)/(93-101) 132/94 mmHg (08/06 1500) SpO2:  [95 %-100 %] 99 % (08/06 1500) Constitutional:  Alert and oriented, No acute distress Cardiovascular: Regular rate and rhythm Respiratory: Normal respiratory effort GI: Abdomen is soft, nontender, nondistended, no abdominal masses GU: penis without drainage or skin breakdown Lymphatic: No lymphadenopathy Neurologic: Grossly intact, no focal deficits Psychiatric: Normal mood and affect  Laboratory Data:   Recent Labs  02/11/14 1136  WBC 7.7  HGB 14.0  HCT 41.3  PLT 187     Recent Labs  02/11/14 1136  NA 143  K 4.5  CL 109  GLUCOSE 109*  BUN 19  CALCIUM 9.1  CREATININE 1.15     Results for orders placed during the hospital encounter of 02/11/14 (from the past 24 hour(s))  CBC     Status: None   Collection Time    02/11/14 11:36 AM      Result Value Ref Range   WBC 7.7  4.0 - 10.5 K/uL   RBC 4.65  4.22 - 5.81 MIL/uL   Hemoglobin 14.0  13.0 - 17.0 g/dL   HCT 91.4  78.2 - 95.6 %   MCV 88.8  78.0 - 100.0 fL   MCH 30.1  26.0 - 34.0 pg   MCHC 33.9  30.0 - 36.0 g/dL   RDW 21.3  08.6 - 57.8 %   Platelets 187  150 - 400 K/uL  COMPREHENSIVE METABOLIC PANEL     Status: Abnormal   Collection Time    02/11/14 11:36 AM      Result Value Ref Range   Sodium 143  137 - 147 mEq/L   Potassium 4.5  3.7 - 5.3 mEq/L   Chloride 109  96 - 112 mEq/L   CO2 22  19 - 32 mEq/L   Glucose, Bld 109 (*) 70 - 99 mg/dL   BUN 19  6 - 23 mg/dL   Creatinine, Ser 4.69  0.50 - 1.35 mg/dL   Calcium 9.1  8.4 - 62.9 mg/dL    Total Protein 6.9  6.0 - 8.3 g/dL   Albumin 3.8  3.5 - 5.2 g/dL   AST 24  0 - 37 U/L   ALT 27  0 - 53 U/L   Alkaline Phosphatase 56  39 - 117 U/L   Total Bilirubin 0.3  0.3 - 1.2 mg/dL   GFR calc non Af Amer 69 (*) >90 mL/min   GFR calc Af Amer 80 (*) >90 mL/min   Anion gap 12  5 - 15   No results found for this or any previous visit (from the past 240 hour(s)).  Renal Function:  Recent Labs  02/11/14 1136  CREATININE 1.15   The CrCl is unknown because both a height and weight (above a minimum accepted value) are required for this calculation.  Radiologic Imaging: Ct Head Wo Contrast  02/11/2014   CLINICAL DATA:  Trauma secondary to fall off of a ladder 10-12 feet.  EXAM: CT HEAD WITHOUT CONTRAST  CT CERVICAL SPINE WITHOUT CONTRAST  TECHNIQUE: Multidetector CT imaging of the head and cervical spine was performed following the standard protocol without intravenous contrast. Multiplanar CT image reconstructions of the cervical spine were also generated.  COMPARISON:  None.  FINDINGS: CT HEAD FINDINGS  No mass lesion. No midline shift. No acute hemorrhage or hematoma. No extra-axial fluid collections. No evidence of acute infarction. Brain parenchyma is normal. Osseous structures are normal.  CT CERVICAL SPINE FINDINGS  There is no fracture, subluxation, or prevertebral soft tissue swelling. There is degenerative disc disease at C5-6 and C6-7 with prominent calcification in the posterior longitudinal ligament at those levels. No significant facet arthritis. There is less prominent degenerative disc disease at C4-5 and C3-4.  IMPRESSION: 1. Normal CT scan of the head. 2. No acute abnormality of the cervical spine.   Electronically Signed   By: Geanie Cooley M.D.   On: 02/11/2014 12:37   Ct Cervical Spine Wo Contrast  02/11/2014   CLINICAL DATA:  Trauma secondary to fall off of a ladder 10-12 feet.  EXAM: CT HEAD WITHOUT CONTRAST  CT CERVICAL SPINE WITHOUT CONTRAST  TECHNIQUE: Multidetector CT  imaging of the head and cervical spine was performed following the standard protocol without intravenous contrast. Multiplanar CT image reconstructions of the cervical spine were also generated.  COMPARISON:  None.  FINDINGS: CT HEAD FINDINGS  No mass lesion. No midline shift. No acute hemorrhage or hematoma. No extra-axial fluid collections. No  evidence of acute infarction. Brain parenchyma is normal. Osseous structures are normal.  CT CERVICAL SPINE FINDINGS  There is no fracture, subluxation, or prevertebral soft tissue swelling. There is degenerative disc disease at C5-6 and C6-7 with prominent calcification in the posterior longitudinal ligament at those levels. No significant facet arthritis. There is less prominent degenerative disc disease at C4-5 and C3-4.  IMPRESSION: 1. Normal CT scan of the head. 2. No acute abnormality of the cervical spine.   Electronically Signed   By: Jim  Maxwell M.D.   OnGeanie Cooley: 02/11/2014 12:37   Ct Abdomen Pelvis W Contrast  02/11/2014   CLINICAL DATA:  Larey SeatFell from roof. Pelvic displacement on standard radiographs.  EXAM: CT ABDOMEN AND PELVIS WITH CONTRAST  TECHNIQUE: Multidetector CT imaging of the abdomen and pelvis was performed using the standard protocol following bolus administration of intravenous contrast.  CONTRAST:  80mL OMNIPAQUE IOHEXOL 300 MG/ML  SOLN  COMPARISON:  Current pelvis radiographs  FINDINGS: The left SI joint is distracted. It is separated by 7 mm with the ileum translating superiorly in relation to the sacrum by 13 mm. The pubic symphysis is widened by 2.3 cm, with the left pubic symphysis displacing superiorly in relation to the same degree as the left ilium in relation to the sacrum.  There are small fractures. There is a small fracture from the posterior medial margin of the left ischium at the level of the mid acetabulum. There is a small fracture from the inferior margin of the ileum along the posterior inferior articular margin of the SI joint. Several  very small bone fragments are noted along the superior margin of the distracted left SI joint. There are fractures of the lateral tips of the spinous processes of L4 and L5.  No other pelvic fractures. No fracture of the proximal femurs. No vertebral body fractures.  Lung bases are essentially clear. Liver, spleen, gallbladder, pancreas, adrenal glands: Unremarkable.  Small low-density renal lesions are noted, most likely cysts. There is mild renal cortical thinning, greater on the left. No hydronephrosis. Normal ureters.  The bladder is unremarkable. There is stranding consistent with hemorrhage/edema in the extraperitoneal space adjacent to the anterior inferior bladder and tracking along the anterior and superior margin of the left SI joint.  No adenopathy. No ascites. No evidence of a bowel or mesenteric injury.  IMPRESSION: 1. Disruption of the left SI joint and symphysis pubis with distraction of the left hemipelvis, displacing laterally and superiorly as detailed above. There are several small fracture fragments adjacent to the SI joint and along the posterior ischium at the mid level of the left acetabulum. There are also fractures of the lateral tips of the left spinous processes of L4 and L5. No other fractures. 2. No evidence of injury to the bladder. 3. No other acute findings.   Electronically Signed   By: Amie Portlandavid  Ormond M.D.   On: 02/11/2014 12:44   Dg Pelvis Portable  02/11/2014   CLINICAL DATA:  Fall.  Left hip pain.  EXAM: PORTABLE PELVIS 1-2 VIEWS  COMPARISON:  None.  FINDINGS: Left SI joint is widened. The ileum has translated 11 mm superiorly in relation to the sacrum. The symphysis pubis is widened to 27 mm. There is also malalignment with the left symphysis higher than the right. There is a small bone fragment adjacent to the inferior margin of the sacrum at the SI joint, which could reflect a phlebolith or small corner fracture from the displaced ileum.  No other evidence  of a fracture. Right  SI joint and both hip joints are normally spaced and aligned. No bone lesion.  IMPRESSION: 1. Disrupted left SI joint and symphysis pubis. The left hemipelvis is displaced superiorly and laterally as detailed above. Possible small corner fracture from the inferior margin of the ileum at the SI joint. No other evidence of a fracture.   Electronically Signed   By: Amie Portland M.D.   On: 02/11/2014 12:06   Dg Chest Port 1 View  02/11/2014   CLINICAL DATA:  Fall.  Left chest pain.  EXAM: PORTABLE CHEST - 1 VIEW  COMPARISON:  11/19/2011  FINDINGS: Heart, mediastinum hila are unremarkable.  Lungs are clear and are symmetrically aerated. No pleural effusion or pneumothorax.  The bony thorax is intact.  IMPRESSION: No active disease.   Electronically Signed   By: Amie Portland M.D.   On: 02/11/2014 12:03   Dg Retrograde-urethrogram  02/11/2014   CLINICAL DATA:  History of trauma with pelvic fracture. Evaluate for urethral injury.  EXAM: RETROGRADE URETHROGRAPHY  TECHNIQUE: The glans of the penis and the urethral meatus was sterilized. Subsequently, a small catheter was advanced through the urethral meatus into the distal aspect of the penile urethra, and iodinated contrast material was injected slowly. Fluoroscopic images were obtained during injection.  COMPARISON:  No priors.  FINDINGS: The penile and distal aspects of the bulbar urethra are grossly normal in appearance. The proximal bulbar urethra tapered and abruptly terminated. Despite multiple attempts at further injection, no contrast would extend beyond this point. No extravasation of contrast material was noted during the examination.  IMPRESSION: 1. Tapering and abrupt narrowing of the urethra in the region of the proximal bulbar urethra. No extravasation of contrast to suggest urethral transection. Findings are suggestive of either a severe stretch injury, extrinsic compression of the proximal bulbar urethra from trauma, and/or severe spasm of the external  urethral sphincter. Urologic consultation is recommended. These results were called by telephone at the time of interpretation on 02/11/2014 at 2:40 pm to Dr. Charma Igo, who verbally acknowledged these results.   Electronically Signed   By: Trudie Reed M.D.   On: 02/11/2014 14:50   Dg Femur Left Port  02/11/2014   CLINICAL DATA:  Left hip pain secondary to a fall.  EXAM: PORTABLE LEFT FEMUR - 2 VIEW  COMPARISON:  Pelvic radiograph dated 02/11/2014  FINDINGS: The left femur is intact. There is no dislocation. There is marked diastases of the symphysis pubis and of the left sacroiliac joint.  IMPRESSION: Normal left femur.   Electronically Signed   By: Geanie Cooley M.D.   On: 02/11/2014 13:10     Impression/Recommendation Urinary retention due to urethral trauma from fall/pelvic injury---Dr. Vernie Ammons will review imaging and perform cysto.  Pt will likely need SP tube.  Urology cart requested bedside.   Silas Flood 02/11/2014, 3:20 PM   Addendum:  The patient was seen and examined. His radiographic images were reviewed and I obtained a history with the use of a hospital interpreter.  He reports having no significant voiding symptoms as a baseline. He does indicate that in the past he has had what he describes as stones but it is difficult to determine if these were kidney or bladder stones or possibly even gallstones. He had been working all day and had not urinated but was not uncomfortable.  His CT scan does reveal a fracture of his pelvis and his cystogram revealed tapering termination likely due a closed sphincter.  There was no evidence of extravasation of urine from the bladder or any bladder injury noted on a CT scan however I did feel further evaluation cystoscopically would be helpful to rule out any form of bladder injury as long as I could gain access to the bladder. I did discuss with the patient the possible scenarios such as normal urethra with abrupt cut off due to a closed  sphincter, partial urethral disruption versus complete urethral disruption. I did discuss with him the possible need for a suprapubic tube if access could not be gained per urethra.  Cystoscopy: I explained the procedure to the patient in detail through the interpreter and he agreed to proceed. 2% lidocaine jelly was instilled in the urethra and a 17 French flexible cystoscope was then passed under direct vision down the urethra. It was noted be entirely normal and there did not appear to be significant stretch or abnormality of the lumen of the urethra in any way. The sphincter was intact and the prostatic urethra revealed no significant obstruction although there was some mild bilobar hypertrophy. The bladder was then entered and fully and systematically inspected. Ureteral orifices were of normal configuration and position with clear reflux. There was no evidence of bladder injury or foreign body such as spicules. The bladder wall was smooth without trabeculation and there were no tumors or stones noted. I therefore removed the cystoscope and proceeded with catheter placement.  A 16 French coud catheter was then passed into the bladder without difficulty with clear urine having returned. I hooked the catheter to closed system drainage.  It would appear that he may not have actually been in retention. He had not urinated likely due to the discomfort of his pelvic fracture but I found no abnormality of the urethra. I suspect he will be a mobile for some time and therefore I would leave the Foley catheter in until he is able to get out of bed and urinate. The catheter can then be removed. At this point there appears to be no urologic abnormalities. He will therefore be seen as needed.

## 2014-02-11 NOTE — ED Notes (Signed)
Traction placed by ortho tech and Dr. Roda ShuttersXu.

## 2014-02-12 ENCOUNTER — Inpatient Hospital Stay (HOSPITAL_COMMUNITY): Payer: BC Managed Care – PPO

## 2014-02-12 ENCOUNTER — Encounter (HOSPITAL_COMMUNITY): Payer: Self-pay | Admitting: Anesthesiology

## 2014-02-12 ENCOUNTER — Inpatient Hospital Stay (HOSPITAL_COMMUNITY): Payer: BC Managed Care – PPO | Admitting: Anesthesiology

## 2014-02-12 ENCOUNTER — Encounter (HOSPITAL_COMMUNITY): Payer: BC Managed Care – PPO | Admitting: Anesthesiology

## 2014-02-12 ENCOUNTER — Encounter (HOSPITAL_COMMUNITY): Admission: EM | Disposition: A | Discharge status: Home Health | Payer: Self-pay | Source: Home / Self Care

## 2014-02-12 HISTORY — PX: ORIF PELVIC FRACTURE: SHX2128

## 2014-02-12 LAB — BASIC METABOLIC PANEL
Anion gap: 12 (ref 5–15)
BUN: 15 mg/dL (ref 6–23)
CO2: 23 mEq/L (ref 19–32)
Calcium: 8.8 mg/dL (ref 8.4–10.5)
Chloride: 102 mEq/L (ref 96–112)
Creatinine, Ser: 1.11 mg/dL (ref 0.50–1.35)
GFR calc Af Amer: 83 mL/min — ABNORMAL LOW (ref >90)
GFR, EST NON AFRICAN AMERICAN: 72 mL/min — AB (ref >90)
GLUCOSE: 100 mg/dL — AB (ref 70–99)
Potassium: 4.5 mEq/L (ref 3.7–5.3)
Sodium: 137 mEq/L (ref 137–147)

## 2014-02-12 LAB — CBC
HEMATOCRIT: 38 % — AB (ref 39.0–52.0)
Hemoglobin: 12.9 g/dL — ABNORMAL LOW (ref 13.0–17.0)
MCH: 29.9 pg (ref 26.0–34.0)
MCHC: 33.9 g/dL (ref 30.0–36.0)
MCV: 88 fL (ref 78.0–100.0)
Platelets: 181 10*3/uL (ref 150–400)
RBC: 4.32 MIL/uL (ref 4.22–5.81)
RDW: 13.5 % (ref 11.5–15.5)
WBC: 9.1 10*3/uL (ref 4.0–10.5)

## 2014-02-12 LAB — MRSA PCR SCREENING: MRSA by PCR: NEGATIVE

## 2014-02-12 LAB — ABO/RH: ABO/RH(D): A POS

## 2014-02-12 LAB — PREPARE RBC (CROSSMATCH)

## 2014-02-12 SURGERY — OPEN REDUCTION INTERNAL FIXATION (ORIF) PELVIC FRACTURE
Anesthesia: General

## 2014-02-12 MED ORDER — 0.9 % SODIUM CHLORIDE (POUR BTL) OPTIME
TOPICAL | Status: DC | PRN
  Administered 2014-02-12: 1000 mL

## 2014-02-12 MED ORDER — LIDOCAINE HCL (CARDIAC) 20 MG/ML IV SOLN
INTRAVENOUS | Status: DC | PRN
  Administered 2014-02-12: 80 mg via INTRAVENOUS

## 2014-02-12 MED ORDER — ROCURONIUM BROMIDE 100 MG/10ML IV SOLN
INTRAVENOUS | Status: DC | PRN
  Administered 2014-02-12: 50 mg via INTRAVENOUS

## 2014-02-12 MED ORDER — HYDROMORPHONE HCL PF 1 MG/ML IJ SOLN
INTRAMUSCULAR | Status: AC
  Filled 2014-02-12: qty 2

## 2014-02-12 MED ORDER — LIDOCAINE HCL (CARDIAC) 20 MG/ML IV SOLN
INTRAVENOUS | Status: AC
  Filled 2014-02-12: qty 5

## 2014-02-12 MED ORDER — FENTANYL CITRATE 0.05 MG/ML IJ SOLN
INTRAMUSCULAR | Status: DC | PRN
  Administered 2014-02-12 (×3): 50 ug via INTRAVENOUS
  Administered 2014-02-12: 100 ug via INTRAVENOUS

## 2014-02-12 MED ORDER — METOCLOPRAMIDE HCL 5 MG/ML IJ SOLN
INTRAMUSCULAR | Status: DC | PRN
  Administered 2014-02-12: 10 mg via INTRAVENOUS

## 2014-02-12 MED ORDER — GLYCOPYRROLATE 0.2 MG/ML IJ SOLN
INTRAMUSCULAR | Status: DC | PRN
  Administered 2014-02-12: .7 mg via INTRAVENOUS

## 2014-02-12 MED ORDER — NEOSTIGMINE METHYLSULFATE 10 MG/10ML IV SOLN
INTRAVENOUS | Status: AC
Start: 2014-02-12 — End: 2014-02-12
  Filled 2014-02-12: qty 1

## 2014-02-12 MED ORDER — PROPOFOL 10 MG/ML IV BOLUS
INTRAVENOUS | Status: AC
  Filled 2014-02-12: qty 20

## 2014-02-12 MED ORDER — LACTATED RINGERS IV SOLN
INTRAVENOUS | Status: DC | PRN
  Administered 2014-02-12 (×3): via INTRAVENOUS

## 2014-02-12 MED ORDER — ONDANSETRON HCL 4 MG/2ML IJ SOLN: 4.0000 mg | Freq: Once | INTRAMUSCULAR | Status: DC | PRN

## 2014-02-12 MED ORDER — HYDROMORPHONE HCL PF 1 MG/ML IJ SOLN
INTRAMUSCULAR | Status: DC | PRN
  Administered 2014-02-12 (×2): 0.5 mg via INTRAVENOUS

## 2014-02-12 MED ORDER — ONDANSETRON HCL 4 MG/2ML IJ SOLN
INTRAMUSCULAR | Status: DC | PRN
  Administered 2014-02-12: 4 mg via INTRAVENOUS

## 2014-02-12 MED ORDER — ACETAMINOPHEN 500 MG PO TABS
1000.0000 mg | ORAL_TABLET | Freq: Three times a day (TID) | ORAL | Status: DC
  Administered 2014-02-12 – 2014-02-17 (×16): 1000 mg via ORAL
  Filled 2014-02-12 (×23): qty 2

## 2014-02-12 MED ORDER — METOCLOPRAMIDE HCL 5 MG/ML IJ SOLN
INTRAMUSCULAR | Status: AC
  Filled 2014-02-12: qty 2

## 2014-02-12 MED ORDER — HYDROMORPHONE HCL PF 1 MG/ML IJ SOLN
0.2500 mg | INTRAMUSCULAR | Status: DC | PRN
  Administered 2014-02-12 (×2): 0.5 mg via INTRAVENOUS

## 2014-02-12 MED ORDER — NEOSTIGMINE METHYLSULFATE 10 MG/10ML IV SOLN
INTRAVENOUS | Status: DC | PRN
  Administered 2014-02-12: 4 mg via INTRAVENOUS

## 2014-02-12 MED ORDER — MIDAZOLAM HCL 2 MG/2ML IJ SOLN
INTRAMUSCULAR | Status: AC
  Filled 2014-02-12: qty 2

## 2014-02-12 MED ORDER — GLYCOPYRROLATE 0.2 MG/ML IJ SOLN
INTRAMUSCULAR | Status: AC
  Filled 2014-02-12: qty 2

## 2014-02-12 MED ORDER — PROPOFOL 10 MG/ML IV BOLUS
INTRAVENOUS | Status: DC | PRN
  Administered 2014-02-12: 200 mg via INTRAVENOUS

## 2014-02-12 MED ORDER — PHENYLEPHRINE HCL 10 MG/ML IJ SOLN
INTRAMUSCULAR | Status: DC | PRN
  Administered 2014-02-12 (×5): 80 ug via INTRAVENOUS

## 2014-02-12 MED ORDER — PHENYLEPHRINE HCL 10 MG/ML IJ SOLN
20.0000 mg | INTRAVENOUS | Status: DC | PRN
  Administered 2014-02-12: 50 ug/min via INTRAVENOUS

## 2014-02-12 MED ORDER — VECURONIUM BROMIDE 10 MG IV SOLR
INTRAVENOUS | Status: DC | PRN
  Administered 2014-02-12 (×2): 3 mg via INTRAVENOUS

## 2014-02-12 MED ORDER — LIDOCAINE HCL 4 % MT SOLN
OROMUCOSAL | Status: DC | PRN
  Administered 2014-02-12: 4 mL via TOPICAL

## 2014-02-12 MED ORDER — SODIUM CHLORIDE 0.9 % IV SOLN: Freq: Once | INTRAVENOUS | Status: DC

## 2014-02-12 MED ORDER — CEFAZOLIN SODIUM 1-5 GM-% IV SOLN
1.0000 g | Freq: Three times a day (TID) | INTRAVENOUS | Status: AC
  Administered 2014-02-12 – 2014-02-13 (×3): 1 g via INTRAVENOUS
  Filled 2014-02-12 (×5): qty 50

## 2014-02-12 MED ORDER — FENTANYL CITRATE 0.05 MG/ML IJ SOLN
INTRAMUSCULAR | Status: AC
  Filled 2014-02-12: qty 5

## 2014-02-12 MED ORDER — HYDROMORPHONE HCL PF 1 MG/ML IJ SOLN
INTRAMUSCULAR | Status: AC
  Filled 2014-02-12: qty 1

## 2014-02-12 MED ORDER — ONDANSETRON HCL 4 MG/2ML IJ SOLN
INTRAMUSCULAR | Status: AC
  Filled 2014-02-12: qty 2

## 2014-02-12 MED ORDER — MIDAZOLAM HCL 5 MG/5ML IJ SOLN
INTRAMUSCULAR | Status: DC | PRN
  Administered 2014-02-12: 2 mg via INTRAVENOUS

## 2014-02-12 MED ORDER — ROCURONIUM BROMIDE 50 MG/5ML IV SOLN
INTRAVENOUS | Status: AC
  Filled 2014-02-12: qty 1

## 2014-02-12 SURGICAL SUPPLY — 59 items
BIT DRILL AO MATTA 2.5MX230M (BIT) IMPLANT
BLADE SURG ROTATE 9660 (MISCELLANEOUS) ×2 IMPLANT
BRUSH SCRUB DISP (MISCELLANEOUS) ×6 IMPLANT
DRAPE C-ARM 42X72 X-RAY (DRAPES) ×3 IMPLANT
DRAPE C-ARMOR (DRAPES) ×3 IMPLANT
DRAPE INCISE IOBAN 66X45 STRL (DRAPES) ×6 IMPLANT
DRAPE ORTHO SPLIT 77X108 STRL (DRAPES) ×6
DRAPE SURG ORHT 6 SPLT 77X108 (DRAPES) ×2 IMPLANT
DRAPE U-SHAPE 47X51 STRL (DRAPES) ×2 IMPLANT
DRILL BIT AO MATTA 2.5MX230M (BIT) ×3
DRSG ADAPTIC 3X8 NADH LF (GAUZE/BANDAGES/DRESSINGS) ×3 IMPLANT
DRSG MEPILEX BORDER 4X4 (GAUZE/BANDAGES/DRESSINGS) ×2 IMPLANT
DRSG MEPILEX BORDER 4X8 (GAUZE/BANDAGES/DRESSINGS) ×4 IMPLANT
ELECT REM PT RETURN 9FT ADLT (ELECTROSURGICAL) ×3
ELECTRODE REM PT RTRN 9FT ADLT (ELECTROSURGICAL) ×1 IMPLANT
GLOVE BIO SURGEON STRL SZ7.5 (GLOVE) ×7 IMPLANT
GLOVE BIO SURGEON STRL SZ8 (GLOVE) ×5 IMPLANT
GLOVE BIOGEL PI IND STRL 7.5 (GLOVE) ×1 IMPLANT
GLOVE BIOGEL PI IND STRL 8 (GLOVE) ×1 IMPLANT
GLOVE BIOGEL PI INDICATOR 7.5 (GLOVE) ×2
GLOVE BIOGEL PI INDICATOR 8 (GLOVE) ×2
GOWN STRL REUS W/ TWL LRG LVL3 (GOWN DISPOSABLE) ×2 IMPLANT
GOWN STRL REUS W/ TWL XL LVL3 (GOWN DISPOSABLE) ×1 IMPLANT
GOWN STRL REUS W/TWL LRG LVL3 (GOWN DISPOSABLE) ×6
GOWN STRL REUS W/TWL XL LVL3 (GOWN DISPOSABLE) ×3
GUIDEPIN 2.8X300 THRD TIP OIC (Screw) ×4 IMPLANT
KIT BASIN OR (CUSTOM PROCEDURE TRAY) ×3 IMPLANT
KIT ROOM TURNOVER OR (KITS) ×3 IMPLANT
MANIFOLD NEPTUNE II (INSTRUMENTS) ×3 IMPLANT
NS IRRIG 1000ML POUR BTL (IV SOLUTION) ×6 IMPLANT
PACK TOTAL JOINT (CUSTOM PROCEDURE TRAY) ×3 IMPLANT
PAD ARMBOARD 7.5X6 YLW CONV (MISCELLANEOUS) ×6 IMPLANT
PLATE SYMPHYSIS 92.5M 6H (Plate) ×2 IMPLANT
SCREW CANN 7.3X135 32MM THRD (Screw) ×2 IMPLANT
SCREW CANN 7.3X140 32MM THRD (Screw) ×2 IMPLANT
SCREW CANN 7.3X85 32MM THRD (Screw) ×2 IMPLANT
SCREW CORT 48X3.5XST NS LF (Screw) IMPLANT
SCREW CORTEX ST MATTA 3.5X20 (Screw) ×2 IMPLANT
SCREW CORTEX ST MATTA 3.5X28MM (Screw) ×2 IMPLANT
SCREW CORTEX ST MATTA 3.5X30MM (Screw) ×2 IMPLANT
SCREW CORTEX ST MATTA 3.5X32MM (Screw) ×2 IMPLANT
SCREW CORTEX ST MATTA 3.5X34MM (Screw) ×2 IMPLANT
SCREW CORTEX ST MATTA 3.5X36MM (Screw) ×2 IMPLANT
SCREW CORTEX ST MATTA 3.5X50MM (Screw) ×2 IMPLANT
SCREW CORTICAL 3.5X48MM (Screw) ×3 IMPLANT
SPONGE LAP 18X18 X RAY DECT (DISPOSABLE) IMPLANT
STAPLER VISISTAT 35W (STAPLE) ×3 IMPLANT
SUCTION FRAZIER TIP 10 FR DISP (SUCTIONS) ×3 IMPLANT
SUT ETHILON 3 0 PS 1 (SUTURE) ×2 IMPLANT
SUT VIC AB 0 CT1 27 (SUTURE)
SUT VIC AB 0 CT1 27XBRD ANBCTR (SUTURE) ×4 IMPLANT
SUT VIC AB 1 CT1 18XCR BRD 8 (SUTURE) ×2 IMPLANT
SUT VIC AB 1 CT1 8-18 (SUTURE) ×3
SUT VIC AB 2-0 CT1 27 (SUTURE) ×3
SUT VIC AB 2-0 CT1 TAPERPNT 27 (SUTURE) ×4 IMPLANT
TOWEL OR 17X24 6PK STRL BLUE (TOWEL DISPOSABLE) ×3 IMPLANT
TOWEL OR 17X26 10 PK STRL BLUE (TOWEL DISPOSABLE) ×6 IMPLANT
WASHER OIC 13MM 6 PACK (Screw) ×4 IMPLANT
WATER STERILE IRR 1000ML POUR (IV SOLUTION) ×12 IMPLANT

## 2014-02-12 NOTE — Consult Note (Addendum)
Orthopaedic Trauma Service Consultation  Reason for Consult: Left hemipelvis dislocation Referring Physician: Eduard Roux, MD; Trauma Service, Doreen Salvage, MD  Edwin Schneider is an 58 y.o. male.  HPI: Golden Circle while descending ladder after two days of power washing house in a harness when the catches between the two parts of the ladder slid past one another.  C/o gas this am.  Denies numbness and tingling.  RUG negative.  From [place]. 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  Dr. Karsten Ro from Urology performed cystoscopy without finding an abnormality and placed catheter that is to be removed once mobile.  Past Medical History  Diagnosis Date  . Gout   . Thyroid disease     Past Surgical History  Procedure Laterality Date  . Knee surgery      No family history on file.  Social History:  reports that he has never smoked. He does not have any smokeless tobacco history on file. He reports that he does not drink alcohol or use illicit drugs.  Allergies: No Known Allergies  Medications: I have reviewed the patient's current medications.  Results for orders placed during the hospital encounter of 02/11/14 (from the past 48 hour(s))  CBC     Status: None   Collection Time    02/11/14 11:36 AM      Result Value Ref Range   WBC 7.7  4.0 - 10.5 K/uL   RBC 4.65  4.22 - 5.81 MIL/uL   Hemoglobin 14.0  13.0 - 17.0 g/dL   HCT 41.3  39.0 - 52.0 %   MCV 88.8  78.0 - 100.0 fL   MCH 30.1  26.0 - 34.0 pg   MCHC 33.9  30.0 - 36.0 g/dL   RDW 13.3  11.5 - 15.5 %   Platelets 187  150 - 400 K/uL  COMPREHENSIVE METABOLIC PANEL     Status: Abnormal   Collection Time    02/11/14 11:36 AM      Result Value Ref Range   Sodium 143  137 - 147 mEq/L   Potassium 4.5  3.7 - 5.3 mEq/L   Chloride 109  96 - 112 mEq/L   CO2 22  19 - 32 mEq/L   Glucose, Bld 109 (*) 70 - 99 mg/dL   BUN 19  6 - 23 mg/dL   Creatinine, Ser 1.15  0.50 - 1.35 mg/dL   Calcium 9.1  8.4 - 10.5 mg/dL   Total Protein 6.9  6.0 - 8.3 g/dL    Albumin 3.8  3.5 - 5.2 g/dL   AST 24  0 - 37 U/L   ALT 27  0 - 53 U/L   Alkaline Phosphatase 56  39 - 117 U/L   Total Bilirubin 0.3  0.3 - 1.2 mg/dL   GFR calc non Af Amer 69 (*) >90 mL/min   GFR calc Af Amer 80 (*) >90 mL/min   Comment: (NOTE)     The eGFR has been calculated using the CKD EPI equation.     This calculation has not been validated in all clinical situations.     eGFR's persistently <90 mL/min signify possible Chronic Kidney     Disease.   Anion gap 12  5 - 15  MRSA PCR SCREENING     Status: None   Collection Time    02/11/14 10:56 PM      Result Value Ref Range   MRSA by PCR NEGATIVE  NEGATIVE   Comment:  The GeneXpert MRSA Assay (FDA     approved for NASAL specimens     only), is one component of a     comprehensive MRSA colonization     surveillance program. It is not     intended to diagnose MRSA     infection nor to guide or     monitor treatment for     MRSA infections.  CBC     Status: Abnormal   Collection Time    02/12/14  2:20 AM      Result Value Ref Range   WBC 9.1  4.0 - 10.5 K/uL   RBC 4.32  4.22 - 5.81 MIL/uL   Hemoglobin 12.9 (*) 13.0 - 17.0 g/dL   HCT 38.0 (*) 39.0 - 52.0 %   MCV 88.0  78.0 - 100.0 fL   MCH 29.9  26.0 - 34.0 pg   MCHC 33.9  30.0 - 36.0 g/dL   RDW 13.5  11.5 - 15.5 %   Platelets 181  150 - 400 K/uL  BASIC METABOLIC PANEL     Status: Abnormal   Collection Time    02/12/14  2:20 AM      Result Value Ref Range   Sodium 137  137 - 147 mEq/L   Potassium 4.5  3.7 - 5.3 mEq/L   Chloride 102  96 - 112 mEq/L   CO2 23  19 - 32 mEq/L   Glucose, Bld 100 (*) 70 - 99 mg/dL   BUN 15  6 - 23 mg/dL   Creatinine, Ser 1.11  0.50 - 1.35 mg/dL   Calcium 8.8  8.4 - 10.5 mg/dL   GFR calc non Af Amer 72 (*) >90 mL/min   GFR calc Af Amer 83 (*) >90 mL/min   Comment: (NOTE)     The eGFR has been calculated using the CKD EPI equation.     This calculation has not been validated in all clinical situations.     eGFR's  persistently <90 mL/min signify possible Chronic Kidney     Disease.   Anion gap 12  5 - 15    Ct Head Wo Contrast  02/11/2014   CLINICAL DATA:  Trauma secondary to fall off of a ladder 10-12 feet.  EXAM: CT HEAD WITHOUT CONTRAST  CT CERVICAL SPINE WITHOUT CONTRAST  TECHNIQUE: Multidetector CT imaging of the head and cervical spine was performed following the standard protocol without intravenous contrast. Multiplanar CT image reconstructions of the cervical spine were also generated.  COMPARISON:  None.  FINDINGS: CT HEAD FINDINGS  No mass lesion. No midline shift. No acute hemorrhage or hematoma. No extra-axial fluid collections. No evidence of acute infarction. Brain parenchyma is normal. Osseous structures are normal.  CT CERVICAL SPINE FINDINGS  There is no fracture, subluxation, or prevertebral soft tissue swelling. There is degenerative disc disease at C5-6 and C6-7 with prominent calcification in the posterior longitudinal ligament at those levels. No significant facet arthritis. There is less prominent degenerative disc disease at C4-5 and C3-4.  IMPRESSION: 1. Normal CT scan of the head. 2. No acute abnormality of the cervical spine.   Electronically Signed   By: Rozetta Nunnery M.D.   On: 02/11/2014 12:37   Ct Cervical Spine Wo Contrast  02/11/2014   CLINICAL DATA:  Trauma secondary to fall off of a ladder 10-12 feet.  EXAM: CT HEAD WITHOUT CONTRAST  CT CERVICAL SPINE WITHOUT CONTRAST  TECHNIQUE: Multidetector CT imaging of the head and cervical spine was performed  following the standard protocol without intravenous contrast. Multiplanar CT image reconstructions of the cervical spine were also generated.  COMPARISON:  None.  FINDINGS: CT HEAD FINDINGS  No mass lesion. No midline shift. No acute hemorrhage or hematoma. No extra-axial fluid collections. No evidence of acute infarction. Brain parenchyma is normal. Osseous structures are normal.  CT CERVICAL SPINE FINDINGS  There is no fracture,  subluxation, or prevertebral soft tissue swelling. There is degenerative disc disease at C5-6 and C6-7 with prominent calcification in the posterior longitudinal ligament at those levels. No significant facet arthritis. There is less prominent degenerative disc disease at C4-5 and C3-4.  IMPRESSION: 1. Normal CT scan of the head. 2. No acute abnormality of the cervical spine.   Electronically Signed   By: Rozetta Nunnery M.D.   On: 02/11/2014 12:37   Ct Abdomen Pelvis W Contrast  02/11/2014   CLINICAL DATA:  Golden Circle from roof. Pelvic displacement on standard radiographs.  EXAM: CT ABDOMEN AND PELVIS WITH CONTRAST  TECHNIQUE: Multidetector CT imaging of the abdomen and pelvis was performed using the standard protocol following bolus administration of intravenous contrast.  CONTRAST:  53m OMNIPAQUE IOHEXOL 300 MG/ML  SOLN  COMPARISON:  Current pelvis radiographs  FINDINGS: The left SI joint is distracted. It is separated by 7 mm with the ileum translating superiorly in relation to the sacrum by 13 mm. The pubic symphysis is widened by 2.3 cm, with the left pubic symphysis displacing superiorly in relation to the same degree as the left ilium in relation to the sacrum.  There are small fractures. There is a small fracture from the posterior medial margin of the left ischium at the level of the mid acetabulum. There is a small fracture from the inferior margin of the ileum along the posterior inferior articular margin of the SI joint. Several very small bone fragments are noted along the superior margin of the distracted left SI joint. There are fractures of the lateral tips of the spinous processes of L4 and L5.  No other pelvic fractures. No fracture of the proximal femurs. No vertebral body fractures.  Lung bases are essentially clear. Liver, spleen, gallbladder, pancreas, adrenal glands: Unremarkable.  Small low-density renal lesions are noted, most likely cysts. There is mild renal cortical thinning, greater on the  left. No hydronephrosis. Normal ureters.  The bladder is unremarkable. There is stranding consistent with hemorrhage/edema in the extraperitoneal space adjacent to the anterior inferior bladder and tracking along the anterior and superior margin of the left SI joint.  No adenopathy. No ascites. No evidence of a bowel or mesenteric injury.  IMPRESSION: 1. Disruption of the left SI joint and symphysis pubis with distraction of the left hemipelvis, displacing laterally and superiorly as detailed above. There are several small fracture fragments adjacent to the SI joint and along the posterior ischium at the mid level of the left acetabulum. There are also fractures of the lateral tips of the left spinous processes of L4 and L5. No other fractures. 2. No evidence of injury to the bladder. 3. No other acute findings.   Electronically Signed   By: DLajean ManesM.D.   On: 02/11/2014 12:44   Dg Pelvis Portable  02/11/2014   CLINICAL DATA:  Fall.  Left hip pain.  EXAM: PORTABLE PELVIS 1-2 VIEWS  COMPARISON:  None.  FINDINGS: Left SI joint is widened. The ileum has translated 11 mm superiorly in relation to the sacrum. The symphysis pubis is widened to 27 mm. There is  also malalignment with the left symphysis higher than the right. There is a small bone fragment adjacent to the inferior margin of the sacrum at the SI joint, which could reflect a phlebolith or small corner fracture from the displaced ileum.  No other evidence of a fracture. Right SI joint and both hip joints are normally spaced and aligned. No bone lesion.  IMPRESSION: 1. Disrupted left SI joint and symphysis pubis. The left hemipelvis is displaced superiorly and laterally as detailed above. Possible small corner fracture from the inferior margin of the ileum at the SI joint. No other evidence of a fracture.   Electronically Signed   By: Lajean Manes M.D.   On: 02/11/2014 12:06   Dg Pelvis Comp Min 3v  02/11/2014   CLINICAL DATA:  Fall.  Pelvic fracture.   EXAM: JUDET PELVIS - 3+ VIEW  COMPARISON:  CT abdomen pelvis 02/11/2014  FINDINGS: Pubic symphysis is diastatic approximately 2 cm. Diastases of the left SI joint. No definite fracture.  Hip joints are normal.  IMPRESSION: Diastases of the left SI joint and pubic symphysis. No fracture identified.   Electronically Signed   By: Franchot Gallo M.D.   On: 02/11/2014 21:40   Dg Chest Port 1 View  02/11/2014   CLINICAL DATA:  Fall.  Left chest pain.  EXAM: PORTABLE CHEST - 1 VIEW  COMPARISON:  11/19/2011  FINDINGS: Heart, mediastinum hila are unremarkable.  Lungs are clear and are symmetrically aerated. No pleural effusion or pneumothorax.  The bony thorax is intact.  IMPRESSION: No active disease.   Electronically Signed   By: Lajean Manes M.D.   On: 02/11/2014 12:03   Dg Retrograde-urethrogram  02/11/2014   CLINICAL DATA:  History of trauma with pelvic fracture. Evaluate for urethral injury.  EXAM: RETROGRADE URETHROGRAPHY  TECHNIQUE: The glans of the penis and the urethral meatus was sterilized. Subsequently, a small catheter was advanced through the urethral meatus into the distal aspect of the penile urethra, and iodinated contrast material was injected slowly. Fluoroscopic images were obtained during injection.  COMPARISON:  No priors.  FINDINGS: The penile and distal aspects of the bulbar urethra are grossly normal in appearance. The proximal bulbar urethra tapered and abruptly terminated. Despite multiple attempts at further injection, no contrast would extend beyond this point. No extravasation of contrast material was noted during the examination.  IMPRESSION: 1. Tapering and abrupt narrowing of the urethra in the region of the proximal bulbar urethra. No extravasation of contrast to suggest urethral transection. Findings are suggestive of either a severe stretch injury, extrinsic compression of the proximal bulbar urethra from trauma, and/or severe spasm of the external urethral sphincter. Urologic  consultation is recommended. These results were called by telephone at the time of interpretation on 02/11/2014 at 2:40 pm to Dr. Silvestre Gunner, who verbally acknowledged these results.   Electronically Signed   By: Vinnie Langton M.D.   On: 02/11/2014 14:50   Dg Femur Left Port  02/11/2014   CLINICAL DATA:  Left hip pain secondary to a fall.  EXAM: PORTABLE LEFT FEMUR - 2 VIEW  COMPARISON:  Pelvic radiograph dated 02/11/2014  FINDINGS: The left femur is intact. There is no dislocation. There is marked diastases of the symphysis pubis and of the left sacroiliac joint.  IMPRESSION: Normal left femur.   Electronically Signed   By: Rozetta Nunnery M.D.   On: 02/11/2014 13:10    ROS Denies recent/ pre-injury urologic, infectious, hematologic, GI, dermatologic, and neurologic symptoms.  Blood pressure 118/75, pulse 73, temperature 99 F (37.2 C), temperature source Oral, resp. rate 22, height _0  (1.727 m), weight 225 lb 8.5 oz (102.3 kg), SpO2 97.00%. Physical Exam Very pleasant RRR CTA S/NT/ND  Pelvis--no traumatic wounds or rash, no ecchymosis, stable to manual stress, tender LLE Traction pin in place  No traumatic wounds, ecchymosis, or rash  Nontender  No effusions  Knee stable to varus/ valgus and anterior/posterior stress  Sens DPN, SPN, TN intact  Motor EHL, ext, flex, evers 5/5  DP 2+, PT 2+, No significant edema RLE No traumatic wounds, ecchymosis, or rash  Nontender  No effusions  Knee stable to varus/ valgus and anterior/posterior stress  Sens DPN, SPN, TN intact  Motor EHL, ext, flex, evers 5/5  DP 2+, PT 2+, No significant edema    Assessment/Plan: L hemipelvis dislocation  Given the complexity of the pelvic fracture pattern, Dr. Erlinda Hong asserted this was outside his scope of practice and that it would be in the best interest of the patient to have these injuries evaluated and treated by a fellowship trained orthopaedic traumatologist. I will assume management.   I  discussed with the patient the risks and benefits of surgery for pelvic ring repair, including the possibility of infection, nerve injury, specifically foot drop, vessel injury, wound breakdown, bladder or urologic injury, arthritis, symptomatic hardware, DVT/ PE, loss of motion, and need for further surgery among others.  He understood these risks and wished to proceed.   Altamese North Robinson, MD [facility] [phone] (p)   02/12/2014  8:16 AM

## 2014-02-12 NOTE — Progress Notes (Signed)
Pt received from PACU, pt sleepy, pt VSS,

## 2014-02-12 NOTE — Progress Notes (Signed)
Pt's family states surgery was not discussed in detail and they would like to wait to sign consent. Consent at front of shadow chart, to be signed in AM.

## 2014-02-12 NOTE — Progress Notes (Signed)
UR completed.  Allayah Raineri, RN BSN [facility] CCM Trauma/Neuro ICU Case Manager [phone]  

## 2014-02-12 NOTE — Progress Notes (Signed)
Patient ID: Edwin Schneider, male   DOB: Aug 18, 1955, 58 y.o.   MRN: [id number]   LOS: 1 day   Subjective: Sleepy from anesthesia. Says he's doing well.   Objective: Vital signs in last 24 hours: Temp:  [97.5 F (36.4 C)-99 F (37.2 C)] 97.5 F (36.4 C) (08/07 1428) Pulse Rate:  [54-83] 70 (08/07 1428) Resp:  [10-26] 19 (08/07 1428) BP: (105-148)/(52-101) 117/78 mmHg (08/07 1428) SpO2:  [92 %-100 %] 97 % (08/07 1428) Weight:  [225 lb 8.5 oz (102.3 kg)] 225 lb 8.5 oz (102.3 kg) (08/06 2005) Last BM Date: 02/12/14   Laboratory  CBC  Recent Labs  02/11/14 1136 02/12/14 0220  WBC 7.7 9.1  HGB 14.0 12.9*  HCT 41.3 38.0*  PLT 187 181   BMET  Recent Labs  02/11/14 1136 02/12/14 0220  NA 143 137  K 4.5 4.5  CL 109 102  CO2 22 23  GLUCOSE 109* 100*  BUN 19 15  CREATININE 1.15 1.11  CALCIUM 9.1 8.8    Physical Exam General appearance: alert and no distress Resp: clear to auscultation bilaterally Cardio: regular rate and rhythm GI: normal findings: bowel sounds normal and soft, non-tender Extremities: NVI   Assessment/Plan: Fall Concussion Multiple pelvic fxs s/p SI screws -- Mobilization per Dr. Carola FrostHandy, NWB LLE ABL anemia -- Mild, check tomorrow Multiple medical problems -- Home meds FEN -- Keep foley in place until mobile per Dr. Vernie Ammonsttelin VTE -- SCD's, Lovenox Dispo -- Transfer to floor, PT/OT     Freeman CaldronMichael J. Ervin Hensley, PA-C Pager: [phone] General Trauma PA Pager: [phone]  02/12/2014

## 2014-02-12 NOTE — Progress Notes (Signed)
Report given to Jamari Diana rn as caregiver 

## 2014-02-12 NOTE — Op Note (Signed)
Edwin Schneider:  Safer, Edwin Schneider             ACCOUNT NO.:  [id number]  MEDICAL RECORD NO.:  [id number]  LOCATION:  [id number]                        FACILITY:  [facility]  PHYSICIAN:  Edwin AlbinoMichael H. Carola Schneider, M.D. DATE OF BIRTH:  09/02/1955  DATE OF PROCEDURE:  02/12/2014 DATE OF DISCHARGE:                              OPERATIVE REPORT   PREOPERATIVE DIAGNOSES:  Pelvic ring disruption with anterior pubic symphysis diastasis and left sacroiliac joint disruption.  Right foot foreign body.  POSTOPERATIVE DIAGNOSES:  Pelvic ring disruption with anterior pubic symphysis diastasis and left sacroiliac joint disruption.  Right foot foreign body.  PROCEDURES: 1. Open reduction and internal fixation of anterior pelvic ring, pubic     symphysis. 2. Bilateral sacroiliac screw fixation, percutaneous. 3. Removal of foreign body, right foot.  SURGEON:  Edwin AlbinoMichael H. Carola Schneider, M.D.  ASSISTANT:  Mearl LatinKeith W Schneider, P.A.  ANESTHESIA:  General.  COMPLICATIONS:  None.  I/O:  2200 mL.  Crystalloid 550 out (urine 400 mL, EBL 150 mL).  DISPOSITION:  To PACU.  CONDITION:  Stable.  BRIEF SUMMARY AND INDICATION FOR PROCEDURE:  Edwin Schneider is a very pleasant 58 year old male who fell off a roof, sustaining pelvic ring injury.  He was initially seen and evaluated by Dr. Earna CoderMichael Schneider.  He was placed in a femoral traction pin and underwent full evaluation by Trauma Service.  This included eventual cystoscopy by Dr. Ihor GullyMark Schneider from Urology for urinary symptoms following the injury.  The patient did not have to undergo repair and had a coude catheter left in place.  I discussed with Edwin Schneider, the risks and benefits of the procedure including the possibility of infection, nerve injury, specifically footdrop, vessel injury, infection, bladder or urethral injury, DVT, PE, sexual dysfunction, arthritis, symptomatic hardware, and anesthetic complications.  The patient acknowledged these risks and did wish to proceed with  repair.  BRIEF SUMMARY OF PROCEDURE:  Edwin Schneider was given Ancef preoperatively, taken to the operating room, where general anesthesia was induced.  His abdomen and left flank were prepped and draped in usual sterile fashion.  We began with a Pfannenstiel incision, 7 cm in length.  Carried dissection down to the pelvic brim where there was some stripping off the left side.  The abdominal insertion was then detached near the brim and the bladder then protected with a malleable retractor. The left hemipelvis was mobile.  I did have the patient in approximately 20 pounds of traction through the femoral pin distally and slightly anteriorly.  I was then able to place two screws in the anterior aspect of the pelvis and used the Jungbluth clamp to achieve an anatomic reduction anteriorly.  A 6-hole plate was then fixed using the pin ultimate holes on each side.  C-arm was brought in and checked AP inlet and outlet films to confirm appropriate reduction, plate placement, trajectory, and length of the screws.  This was followed by placement of additional screws and compression both on the right and the left sides. Following reconstruction of the anterior ring, I then turned my attention posteriorly.  Here, a guide pin for the 7.3 cannulated screws was inserted to the appropriate depth since starting point trajectory, such that I could place a  transsacral screw into the right SI if this should need to be used for additional fixation.  I was careful to make sure the trajectory was perfect, so that I did not injure the L5 nerve on the left or on the right side as approaching that SI joint across the ala.  The screw was inserted and then engaged in the SI joint on the right in the hard bone, I did not initially drill this area.  I advanced the screw down and was able to compress the SI joint, but not to the degree of closure that I had anticipated.  My assistant was applying retraction and  reduction of the pelvic ring posteriorly during this procedure.  Consequently, I took that screw out and drilled into the far bone to make sure that I was able to achieve adequate purchase there and penetrate the subchondral bone.  This was followed by replacement of the screw.  I then augmented the fixation with an additional S2 screw and put this simply into the vertebral body and did not go all the way across into the SI joint.  I did achieve excellent bite with the S2 screw and was able to tighten further the S1 as well.  Final images showed appropriate trajectory across the right and left sides and dramatically improved reduction.  The anterior ring remained anatomic. Edwin Morita, PA-C, assisted me throughout for retraction anteriorly, also some assistance with closure and exposure as well as with the reduction of the posterior ring for placement of the SI screws.  Standard layered closure was performed with #1 figure-of-eight Vicryls for the abdominal musculature and 0 Vicryl, then 2-0 Vicryl, and nylon for the skin, 2-0 Vicryl and nylon for the SI wounds.  We then removed a splinter from the plantar aspect of the right foot where wood had penetrated into the soft tissues.  We then cleaned the area thoroughly with alcohol scrub and applied a dressing.  The upper leg dressing was placed on the anterior pelvic wound and the SI wound. The patient was awaken from anesthesia and transported to the PACU in stable condition.  PROGNOSIS:  Edwin Schneider will be touchdown weightbearing on the left lower extremity for the next 8 to 10 weeks with resumption of full weightbearing on the left side at 3 months.  I have discussed this with family and we will examine the patient postoperatively to make certain that he does not have any unexpected L5 symptoms.  We would anticipate several days in the hospital with discharge home given his high level of social support, but this would be predicated  upon his ability to mobilize.  He will be on pharmacologic DVT prophylaxis and can be managed by the Trauma Service.     Edwin Albino. Carola Frost, M.D.     MHH/MEDQ  D:  02/12/2014  T:  02/12/2014  Job:  [id number]

## 2014-02-12 NOTE — Brief Op Note (Signed)
02/11/2014 - 02/12/2014  1:26 PM  PATIENT:  Edwin Schneider  58 y.o. male  PRE-OPERATIVE DIAGNOSIS:   1. Pubic symphysis and left SI dislocation (Left hemipelvis dislocation) 2. Right foot foreign body  POST-OPERATIVE DIAGNOSIS:   1. Pubic symphysis and left SI dislocation (Left hemipelvis dislocation) 2. Right foot foreign body  PROCEDURE:  Procedure(s): 1. OPEN REDUCTION INTERNAL FIXATION (ORIF) PELVIC RING (N/A) Anterior Pubic Symphysis 2. Bilateral SI screw fixation 3. Removal of foreign body right foot  SURGEON:  Surgeon(s) and Role:    * Budd PalmerMichael H Tersa Fotopoulos, MD - Primary  PHYSICIAN ASSISTANT: Montez MoritaKeith Paul, PA-C  ANESTHESIA:   general  I/O:  Total I/O In: 2200 [I.V.:2200] Out: 550 [Urine:400; Blood:150]  SPECIMEN:  No Specimen  TOURNIQUET:  * No tourniquets in log *  DICTATION: .Other Dictation: Dictation Number [id number]

## 2014-02-12 NOTE — Anesthesia Preprocedure Evaluation (Signed)
Anesthesia Evaluation  Patient identified by MRN, date of birth, ID band Patient awake    Reviewed: Allergy & Precautions, H&P , NPO status , Patient's Chart, lab work & pertinent test results  Airway       Dental   Pulmonary          Cardiovascular     Neuro/Psych    GI/Hepatic GERD-  ,  Endo/Other  Hypothyroidism   Renal/GU      Musculoskeletal   Abdominal   Peds  Hematology   Anesthesia Other Findings   Reproductive/Obstetrics                           Anesthesia Physical Anesthesia Plan  ASA: I  Anesthesia Plan: General   Post-op Pain Management:    Induction: Intravenous  Airway Management Planned: Oral ETT  Additional Equipment:   Intra-op Plan:   Post-operative Plan: Extubation in OR  Informed Consent: I have reviewed the patients History and Physical, chart, labs and discussed the procedure including the risks, benefits and alternatives for the proposed anesthesia with the patient or authorized representative who has indicated his/her understanding and acceptance.     Plan Discussed with: CRNA, Anesthesiologist and Surgeon  Anesthesia Plan Comments:         Anesthesia Quick Evaluation

## 2014-02-12 NOTE — Transfer of Care (Signed)
Immediate Anesthesia Transfer of Care Note  Patient: Edwin Schneider  Procedure(s) Performed: Procedure(s): OPEN REDUCTION INTERNAL FIXATION (ORIF) PELVIC RING (N/A)  Patient Location: PACU  Anesthesia Type:General  Level of Consciousness: awake, alert , oriented and patient cooperative  Airway & Oxygen Therapy: Patient Spontanous Breathing and Patient connected to nasal cannula oxygen  Post-op Assessment: Report given to PACU RN and Post -op Vital signs reviewed and stable  Post vital signs: Reviewed and stable  Complications: No apparent anesthesia complications

## 2014-02-12 NOTE — Progress Notes (Addendum)
Pt with with tx 5 [place] per MD order, pt VSS, pt and family verbalized understanding of tx,report called to receiving RN all questions answered, pt c/i not being able to urinate, foley repositioned and drained, pt expressed felt better

## 2014-02-13 LAB — CBC
HCT: 35.1 % — ABNORMAL LOW (ref 39.0–52.0)
Hemoglobin: 11.8 g/dL — ABNORMAL LOW (ref 13.0–17.0)
MCH: 29.8 pg (ref 26.0–34.0)
MCHC: 33.6 g/dL (ref 30.0–36.0)
MCV: 88.6 fL (ref 78.0–100.0)
PLATELETS: 175 10*3/uL (ref 150–400)
RBC: 3.96 MIL/uL — ABNORMAL LOW (ref 4.22–5.81)
RDW: 13.5 % (ref 11.5–15.5)
WBC: 9.5 10*3/uL (ref 4.0–10.5)

## 2014-02-13 MED ORDER — DOCUSATE SODIUM 100 MG PO CAPS
100.0000 mg | ORAL_CAPSULE | Freq: Two times a day (BID) | ORAL | Status: DC
  Administered 2014-02-13 – 2014-02-17 (×9): 100 mg via ORAL
  Filled 2014-02-13 (×10): qty 1

## 2014-02-13 MED ORDER — BISACODYL 10 MG RE SUPP
10.0000 mg | Freq: Every day | RECTAL | Status: DC | PRN
  Administered 2014-02-14: 10 mg via RECTAL
  Filled 2014-02-13: qty 1

## 2014-02-13 NOTE — Progress Notes (Signed)
Occupational Therapy Evaluation Patient Details Name: Edwin Schneider MRN: [id number] DOB: 1955/12/24 Today's Date: 02/13/2014    History of Present Illness 58 y.o. male s/p OPEN REDUCTION INTERNAL FIXATION (ORIF) PELVIC RING  Hx of gout and thyroid disease.   Clinical Impression   PTA pt lived at home and was independent with ADLs and functional mobility. Pt currently limited by pain and limited ROM due to pelvic fx and results in difficulty with LB ADLs and functional mobility. Pt would benefit from skilled OT for AE training for LB ADLs and for increased mobility.     Follow Up Recommendations  No OT follow up;Supervision/Assistance - 24 hour    Equipment Recommendations  3 in 1 bedside comode       Precautions / Restrictions Precautions Precautions: Fall Restrictions Weight Bearing Restrictions: Yes LLE Weight Bearing: Non weight bearing      Mobility Bed Mobility               General bed mobility comments: Pt sitting up in recliner when OT arrived.   Transfers Overall transfer level: Needs assistance Equipment used: Rolling walker (2 wheeled) Transfers: Sit to/from [employer] Sit to Stand: Min assist Stand pivot transfers: Min assist       General transfer comment: Min (A) to power up and maintain NWB status. Min (A) to stabilize and control walker during transfer. Educated pt on WB through UE for improved mobility and maintain NWB status.     Balance Overall balance assessment: Needs assistance Sitting-balance support: No upper extremity supported;Feet supported Sitting balance-Leahy Scale: Fair     Standing balance support: Bilateral upper extremity supported;During functional activity Standing balance-Leahy Scale: Poor                              ADL Overall ADL's : Needs assistance/impaired Eating/Feeding: Independent;Sitting   Grooming: Set up;Sitting   Upper Body Bathing: Set up;Sitting   Lower Body Bathing:  Moderate assistance;Sit to/from stand   Upper Body Dressing : Set up;Sitting   Lower Body Dressing: Maximal assistance;Sit to/from stand   Toilet Transfer: Minimal assistance;Stand-pivot;RW;BSC             General ADL Comments: Pt moving well and recently given pain medication. Demonstrated some short term memory deficits which may be related to pain medication. Pt also speaks spanish, however has enough English to communicate. Offered Interpreter for next session and pt declined. Pt requires assist for LB ADLs due to limited ROM and pain from pelvic fx. Educated pt on NWB status and purpose for optimal healing. Demonstrated use of BSC.      Vision  Pt reports no change from baseline. No apparent visual deficits.                    Perception Perception Perception Tested?: No   Praxis Praxis Praxis tested?: Within functional limits    Pertinent Vitals/Pain Pain Assessment: No/denies pain (at rest; recently medicated)     Hand Dominance Right   Extremity/Trunk Assessment Upper Extremity Assessment Upper Extremity Assessment: Overall WFL for tasks assessed (5/5 throughout bil UEs)   Lower Extremity Assessment Lower Extremity Assessment: Defer to PT evaluation   Cervical / Trunk Assessment Cervical / Trunk Assessment: Normal   Communication Communication Communication: No difficulties   Cognition Arousal/Alertness: Awake/alert Behavior During Therapy: WFL for tasks assessed/performed Overall Cognitive Status: Within Functional Limits for tasks assessed  Home Living Family/patient expects to be discharged to:: Private residence Living Arrangements: Spouse/significant other;Children Available Help at Discharge: Family;Available 24 hours/day Type of Home: House Home Access: Stairs to enter [employer] CorporationEntrance Stairs-Number of Steps: 3 Entrance Stairs-Rails: None Home Layout: One level     Bathroom Shower/Tub: Tub/shower  unit;Walk-in shower Shower/tub characteristics: Engineer, building servicesCurtain Bathroom Toilet: Standard     Home Equipment: None          Prior Functioning/Environment Level of Independence: Independent             OT Diagnosis: Generalized weakness;Acute pain   OT Problem List: Decreased strength;Decreased range of motion;Decreased activity tolerance;Impaired balance (sitting and/or standing);Decreased safety awareness;Decreased knowledge of use of DME or AE;Decreased knowledge of precautions;Pain   OT Treatment/Interventions: Self-care/ADL training;Therapeutic exercise;Energy conservation;DME and/or AE instruction;Therapeutic activities;Patient/family education;Balance training    OT Goals(Current goals can be found in the care plan section) Acute Rehab OT Goals Patient Stated Goal: be strong OT Goal Formulation: With patient Time For Goal Achievement: 02/27/14 Potential to Achieve Goals: Good ADL Goals Pt Will Perform Grooming: with supervision;standing Pt Will Perform Lower Body Bathing: with supervision;with adaptive equipment;sit to/from stand;with set-up Pt Will Perform Lower Body Dressing: with set-up;with supervision;with adaptive equipment;sit to/from stand Pt Will Transfer to Toilet: with supervision;ambulating;bedside commode Pt Will Perform Toileting - Clothing Manipulation and hygiene: with supervision;sit to/from stand Pt Will Perform Tub/Shower Transfer: with supervision;ambulating;3 in 1;rolling walker Additional ADL Goal #1: Pt will perform bed mobility with supervision to prepare for ADLs.   OT Frequency: Min 2X/week    End of Session Equipment Utilized During Treatment: Gait belt;Rolling walker Nurse Communication: Mobility status  Activity Tolerance: Patient tolerated treatment well Patient left: in chair;with call bell/phone within reach;with family/visitor present   Time: 1725-1747 OT Time Calculation (min): 22 min Charges:  OT General Charges $OT Visit: 1  Procedure OT Evaluation $Initial OT Evaluation Tier I: 1 Procedure OT Treatments $Self Care/Home Management : 8-22 mins  Rae LipsMiller, Tarrance Januszewski M [phone] 02/13/2014, 6:02 PM

## 2014-02-13 NOTE — Evaluation (Signed)
Physical Therapy Evaluation Patient Details Name: Edwin Schneider MRN: [id number] DOB: 1956/04/01 Today's Date: 02/13/2014   History of Present Illness  58 y.o. male s/p OPEN REDUCTION INTERNAL FIXATION (ORIF) PELVIC RING  Hx of gout and thyroid disease.  Clinical Impression  Patient is seen following the above procedure and presents with limitations due to the deficits listed below (see PT Problem List). Requires min assist for bed mobility and transfer; unable to ambulate secondary to pain and decreased UE strength with rolling walker. Anticipate he will progress well with therapy and be able to d/c to home with 24 hour care from family. Patient will benefit from skilled PT to increase their independence and safety with mobility to allow discharge to the venue listed below.       Follow Up Recommendations Home health PT;Supervision for mobility/OOB    Equipment Recommendations  3in1 (PT);Rolling walker with 5" wheels    Recommendations for Other Services OT consult     Precautions / Restrictions Precautions Precautions: Fall Restrictions Weight Bearing Restrictions: Yes LLE Weight Bearing: Non weight bearing      Mobility  Bed Mobility Overal bed mobility: Needs Assistance Bed Mobility: Supine to Sit     Supine to sit: Min assist     General bed mobility comments: Min assist for LLE support with VC for technique. Relies heavily on bed rail.  Transfers Overall transfer level: Needs assistance Equipment used: Rolling walker (2 wheeled) Transfers: Sit to/from [employer] Sit to Stand: Min assist Stand pivot transfers: Min assist       General transfer comment: Min assist for boost from lowest bed setting and to maintain NWB on LLE. Once standing pt required min assist for walker control with VC for technique and sequencing. Instructed to use UEs for decreased WB through LLE. Able to pivot with good NWB ability on LLE.  Ambulation/Gait                 Stairs            Wheelchair Mobility    Modified Rankin (Stroke Patients Only)       Balance Overall balance assessment: Needs assistance Sitting-balance support: No upper extremity supported;Feet supported Sitting balance-Leahy Scale: Fair     Standing balance support: Single extremity supported Standing balance-Leahy Scale: Poor                               Pertinent Vitals/Pain Pain Assessment: 0-10 Pain Score: 4  Pain Location: Pelvis Pain Descriptors / Indicators: Constant Pain Intervention(s): Limited activity within patient's tolerance;Monitored during session;Repositioned    Home Living Family/patient expects to be discharged to:: Private residence Living Arrangements: Spouse/significant other;Children Available Help at Discharge: Family;Available 24 hours/day Type of Home: House Home Access: Stairs to enter Entrance Stairs-Rails: None Entrance Stairs-Number of Steps: 3 Home Layout: One level Home Equipment: None      Prior Function Level of Independence: Independent               Hand Dominance   Dominant Hand: Right    Extremity/Trunk Assessment   Upper Extremity Assessment: Defer to OT evaluation           Lower Extremity Assessment: LLE deficits/detail   LLE Deficits / Details: Decreased strength and ROM as expected post op - no abnormal sensation noted bilaterally.     Communication   Communication: No difficulties  Cognition Arousal/Alertness: Awake/alert Behavior During Therapy: [place]  for tasks assessed/performed Overall Cognitive Status: Within Functional Limits for tasks assessed                      General Comments      Exercises General Exercises - Lower Extremity Ankle Circles/Pumps: AROM;Both;10 reps;Seated Quad Sets: AROM;Left;10 reps;Seated Gluteal Sets: Strengthening;Both;10 reps;Seated      Assessment/Plan    PT Assessment Patient needs continued PT services  PT  Diagnosis Difficulty walking;Acute pain   PT Problem List Decreased strength;Decreased range of motion;Decreased activity tolerance;Decreased balance;Decreased mobility;Decreased knowledge of use of DME;Decreased knowledge of precautions;Pain  PT Treatment Interventions DME instruction;Gait training;Stair training;Functional mobility training;Therapeutic activities;Therapeutic exercise;Balance training;Neuromuscular re-education;Patient/family education;Modalities   PT Goals (Current goals can be found in the Care Plan section) Acute Rehab PT Goals Patient Stated Goal: Get better PT Goal Formulation: With patient Time For Goal Achievement: 02/20/14 Potential to Achieve Goals: Good    Frequency Min 5X/week   Barriers to discharge Inaccessible home environment stairs to enter/exit home    Co-evaluation               End of Session   Activity Tolerance: Patient limited by pain Patient left: in chair;with call bell/phone within reach;with family/visitor present Nurse Communication: Mobility status         Time: [id number] PT Time Calculation (min): 41 min   Charges:   PT Evaluation $Initial PT Evaluation Tier I: 1 Procedure PT Treatments $Therapeutic Activity: 23-37 mins   PT G Codes:         Charlsie MerlesLogan Secor Lilya Smitherman, [place] CarolinaPT [phone]  Berton MountBarbour, Pricella Gaugh S 02/13/2014, 1:41 PM

## 2014-02-13 NOTE — Progress Notes (Signed)
Subjective: 1 Day Post-Op Procedure(s) (LRB): OPEN REDUCTION INTERNAL FIXATION (ORIF) PELVIC RING (N/A) Patient reports pain as 2 on 0-10 scale.  Minimal pain noted.  Patient sleeping comfortably.  No nausea/vomiting, lightheadedness/dizziness.  No flatus and no bm as of yet.  Tolerating diet.  Objective: Vital signs in last 24 hours: Temp:  [97.5 F (36.4 C)-99.3 F (37.4 C)] 99.3 F (37.4 C) (08/08 0610) Pulse Rate:  [68-98] 87 (08/08 0610) Resp:  [10-26] 20 (08/08 0610) BP: (105-134)/(52-85) 134/82 mmHg (08/08 0610) SpO2:  [92 %-100 %] 100 % (08/08 0610) Weight:  [90.8 kg (200 lb 2.8 oz)] 90.8 kg (200 lb 2.8 oz) (08/07 2114)  Intake/Output from previous day: 08/07 0701 - 08/08 0700 In: 2640 [P.O.:240; I.V.:2400] Out: 3850 [Urine:3700; Blood:150] Intake/Output this shift: Total I/O In: 270 [P.O.:120; IV Piggyback:150] Out: 250 [Urine:250]   Recent Labs  02/11/14 1136 02/12/14 0220 02/13/14 0515  HGB 14.0 12.9* 11.8*    Recent Labs  02/12/14 0220 02/13/14 0515  WBC 9.1 9.5  RBC 4.32 3.96*  HCT 38.0* 35.1*  PLT 181 175    Recent Labs  02/11/14 1136 02/12/14 0220  NA 143 137  K 4.5 4.5  CL 109 102  CO2 22 23  BUN 19 15  CREATININE 1.15 1.11  GLUCOSE 109* 100*  CALCIUM 9.1 8.8   No results found for this basename: LABPT, INR,  in the last 72 hours  Neurologically intact Neurovascular intact Sensation intact distally Intact pulses distally Dorsiflexion/Plantar flexion intact Compartment soft No drainage noted through dressings Negative homen's bilaterally  Assessment/Plan: 1 Day Post-Op Procedure(s) (LRB): OPEN REDUCTION INTERNAL FIXATION (ORIF) PELVIC RING (N/A) Advance diet Up with PT NWB LLE.  No ROM restrictions ABLA:  Pre-op hgb of 14.0. Pod#1 hgb of 11.8. Patient asymptomatic but will continue to follow  ANTON, M. LINDSEY 02/13/2014, 10:29 AM

## 2014-02-13 NOTE — Progress Notes (Signed)
1 Day Post-Op  Subjective: Sore but ok, having some flatus  Objective: Vital signs in last 24 hours: Temp:  [97.5 F (36.4 C)-99.4 F (37.4 C)] 99.4 F (37.4 C) (08/08 1051) Pulse Rate:  [68-98] 93 (08/08 1051) Resp:  [10-26] 18 (08/08 1051) BP: (105-135)/(52-85) 135/75 mmHg (08/08 1051) SpO2:  [92 %-100 %] 96 % (08/08 1051) Weight:  [200 lb 2.8 oz (90.8 kg)] 200 lb 2.8 oz (90.8 kg) (08/07 2114) Last BM Date: 02/11/14  Intake/Output from previous day: 08/07 0701 - 08/08 0700 In: 2640 [P.O.:240; I.V.:2400] Out: 3850 [Urine:3700; Blood:150] Intake/Output this shift: Total I/O In: 270 [P.O.:120; IV Piggyback:150] Out: 250 [Urine:250]  General appearance: no distress Resp: clear to auscultation bilaterally Cardio: regular rate and rhythm GI: mild distended, bs present nontender  Lab Results:   Recent Labs  02/12/14 0220 02/13/14 0515  WBC 9.1 9.5  HGB 12.9* 11.8*  HCT 38.0* 35.1*  PLT 181 175   BMET  Recent Labs  02/11/14 1136 02/12/14 0220  NA 143 137  K 4.5 4.5  CL 109 102  CO2 22 23  GLUCOSE 109* 100*  BUN 19 15  CREATININE 1.15 1.11  CALCIUM 9.1 8.8   PT/INR No results found for this basename: LABPROT, INR,  in the last 72 hours ABG No results found for this basename: PHART, PCO2, PO2, HCO3,  in the last 72 hours  Studies/Results: Dg Si Joints  02/12/2014   CLINICAL DATA:  Previous trauma  EXAM: BILATERAL SACROILIAC JOINTS - 3+ VIEW  COMPARISON:  02/11/2014  FINDINGS: Multiple spot films were obtained intraoperatively and reveal fixation of the pubic symphysis as well as pinning of the sacroiliac joints. The previously seen diastases has been reduced. 1 min and 26 seconds of fluoroscopy was utilized.   Electronically Signed   By: Alcide CleverMark  Lukens M.D.   On: 02/12/2014 12:00   Ct Head Wo Contrast  02/11/2014   CLINICAL DATA:  Trauma secondary to fall off of a ladder 10-12 feet.  EXAM: CT HEAD WITHOUT CONTRAST  CT CERVICAL SPINE WITHOUT CONTRAST  TECHNIQUE:  Multidetector CT imaging of the head and cervical spine was performed following the standard protocol without intravenous contrast. Multiplanar CT image reconstructions of the cervical spine were also generated.  COMPARISON:  None.  FINDINGS: CT HEAD FINDINGS  No mass lesion. No midline shift. No acute hemorrhage or hematoma. No extra-axial fluid collections. No evidence of acute infarction. Brain parenchyma is normal. Osseous structures are normal.  CT CERVICAL SPINE FINDINGS  There is no fracture, subluxation, or prevertebral soft tissue swelling. There is degenerative disc disease at C5-6 and C6-7 with prominent calcification in the posterior longitudinal ligament at those levels. No significant facet arthritis. There is less prominent degenerative disc disease at C4-5 and C3-4.  IMPRESSION: 1. Normal CT scan of the head. 2. No acute abnormality of the cervical spine.   Electronically Signed   By: Geanie CooleyJim  Maxwell M.D.   On: 02/11/2014 12:37   Ct Cervical Spine Wo Contrast  02/11/2014   CLINICAL DATA:  Trauma secondary to fall off of a ladder 10-12 feet.  EXAM: CT HEAD WITHOUT CONTRAST  CT CERVICAL SPINE WITHOUT CONTRAST  TECHNIQUE: Multidetector CT imaging of the head and cervical spine was performed following the standard protocol without intravenous contrast. Multiplanar CT image reconstructions of the cervical spine were also generated.  COMPARISON:  None.  FINDINGS: CT HEAD FINDINGS  No mass lesion. No midline shift. No acute hemorrhage or hematoma. No extra-axial fluid  collections. No evidence of acute infarction. Brain parenchyma is normal. Osseous structures are normal.  CT CERVICAL SPINE FINDINGS  There is no fracture, subluxation, or prevertebral soft tissue swelling. There is degenerative disc disease at C5-6 and C6-7 with prominent calcification in the posterior longitudinal ligament at those levels. No significant facet arthritis. There is less prominent degenerative disc disease at C4-5 and C3-4.   IMPRESSION: 1. Normal CT scan of the head. 2. No acute abnormality of the cervical spine.   Electronically Signed   By: Geanie Cooley M.D.   On: 02/11/2014 12:37   Ct Abdomen Pelvis W Contrast  02/11/2014   CLINICAL DATA:  Larey Seat from roof. Pelvic displacement on standard radiographs.  EXAM: CT ABDOMEN AND PELVIS WITH CONTRAST  TECHNIQUE: Multidetector CT imaging of the abdomen and pelvis was performed using the standard protocol following bolus administration of intravenous contrast.  CONTRAST:  80mL OMNIPAQUE IOHEXOL 300 MG/ML  SOLN  COMPARISON:  Current pelvis radiographs  FINDINGS: The left SI joint is distracted. It is separated by 7 mm with the ileum translating superiorly in relation to the sacrum by 13 mm. The pubic symphysis is widened by 2.3 cm, with the left pubic symphysis displacing superiorly in relation to the same degree as the left ilium in relation to the sacrum.  There are small fractures. There is a small fracture from the posterior medial margin of the left ischium at the level of the mid acetabulum. There is a small fracture from the inferior margin of the ileum along the posterior inferior articular margin of the SI joint. Several very small bone fragments are noted along the superior margin of the distracted left SI joint. There are fractures of the lateral tips of the spinous processes of L4 and L5.  No other pelvic fractures. No fracture of the proximal femurs. No vertebral body fractures.  Lung bases are essentially clear. Liver, spleen, gallbladder, pancreas, adrenal glands: Unremarkable.  Small low-density renal lesions are noted, most likely cysts. There is mild renal cortical thinning, greater on the left. No hydronephrosis. Normal ureters.  The bladder is unremarkable. There is stranding consistent with hemorrhage/edema in the extraperitoneal space adjacent to the anterior inferior bladder and tracking along the anterior and superior margin of the left SI joint.  No adenopathy. No  ascites. No evidence of a bowel or mesenteric injury.  IMPRESSION: 1. Disruption of the left SI joint and symphysis pubis with distraction of the left hemipelvis, displacing laterally and superiorly as detailed above. There are several small fracture fragments adjacent to the SI joint and along the posterior ischium at the mid level of the left acetabulum. There are also fractures of the lateral tips of the left spinous processes of L4 and L5. No other fractures. 2. No evidence of injury to the bladder. 3. No other acute findings.   Electronically Signed   By: Amie Portland M.D.   On: 02/11/2014 12:44   Dg Pelvis Portable  02/11/2014   CLINICAL DATA:  Fall.  Left hip pain.  EXAM: PORTABLE PELVIS 1-2 VIEWS  COMPARISON:  None.  FINDINGS: Left SI joint is widened. The ileum has translated 11 mm superiorly in relation to the sacrum. The symphysis pubis is widened to 27 mm. There is also malalignment with the left symphysis higher than the right. There is a small bone fragment adjacent to the inferior margin of the sacrum at the SI joint, which could reflect a phlebolith or small corner fracture from the displaced ileum.  No other evidence of a fracture. Right SI joint and both hip joints are normally spaced and aligned. No bone lesion.  IMPRESSION: 1. Disrupted left SI joint and symphysis pubis. The left hemipelvis is displaced superiorly and laterally as detailed above. Possible small corner fracture from the inferior margin of the ileum at the SI joint. No other evidence of a fracture.   Electronically Signed   By: Amie Portland M.D.   On: 02/11/2014 12:06   Dg Pelvis Comp Min 3v  02/12/2014   CLINICAL DATA:  Status post sacroiliac joint pinning and pubic symphysis fixation  EXAM: JUDET PELVIS - 3+ VIEW  COMPARISON:  Films from earlier in the same day, 02/11/2014  FINDINGS: A fixation sideplate is noted along the superior aspect of the pubic symphysis extending into the superior pubic rami bilaterally. The  previously seen diastases has been reduced. Two fixation screws are noted traversing the sacroiliac joint on the left. The longer of the screws extends across the sacroiliac joint on the right as well. There has been significant reduction in the SI joints separation on the left when compared with the prior exam.   Electronically Signed   By: Alcide Clever M.D.   On: 02/12/2014 13:42   Dg Pelvis Comp Min 3v  02/11/2014   CLINICAL DATA:  Fall.  Pelvic fracture.  EXAM: JUDET PELVIS - 3+ VIEW  COMPARISON:  CT abdomen pelvis 02/11/2014  FINDINGS: Pubic symphysis is diastatic approximately 2 cm. Diastases of the left SI joint. No definite fracture.  Hip joints are normal.  IMPRESSION: Diastases of the left SI joint and pubic symphysis. No fracture identified.   Electronically Signed   By: Marlan Palau M.D.   On: 02/11/2014 21:40   Dg Chest Port 1 View  02/11/2014   CLINICAL DATA:  Fall.  Left chest pain.  EXAM: PORTABLE CHEST - 1 VIEW  COMPARISON:  11/19/2011  FINDINGS: Heart, mediastinum hila are unremarkable.  Lungs are clear and are symmetrically aerated. No pleural effusion or pneumothorax.  The bony thorax is intact.  IMPRESSION: No active disease.   Electronically Signed   By: Amie Portland M.D.   On: 02/11/2014 12:03   Dg Retrograde-urethrogram  02/11/2014   CLINICAL DATA:  History of trauma with pelvic fracture. Evaluate for urethral injury.  EXAM: RETROGRADE URETHROGRAPHY  TECHNIQUE: The glans of the penis and the urethral meatus was sterilized. Subsequently, a small catheter was advanced through the urethral meatus into the distal aspect of the penile urethra, and iodinated contrast material was injected slowly. Fluoroscopic images were obtained during injection.  COMPARISON:  No priors.  FINDINGS: The penile and distal aspects of the bulbar urethra are grossly normal in appearance. The proximal bulbar urethra tapered and abruptly terminated. Despite multiple attempts at further injection, no contrast  would extend beyond this point. No extravasation of contrast material was noted during the examination.  IMPRESSION: 1. Tapering and abrupt narrowing of the urethra in the region of the proximal bulbar urethra. No extravasation of contrast to suggest urethral transection. Findings are suggestive of either a severe stretch injury, extrinsic compression of the proximal bulbar urethra from trauma, and/or severe spasm of the external urethral sphincter. Urologic consultation is recommended. These results were called by telephone at the time of interpretation on 02/11/2014 at 2:40 pm to Dr. Charma Igo, who verbally acknowledged these results.   Electronically Signed   By: Trudie Reed M.D.   On: 02/11/2014 14:50   Dg Femur Left Port  02/11/2014   CLINICAL DATA:  Left hip pain secondary to a fall.  EXAM: PORTABLE LEFT FEMUR - 2 VIEW  COMPARISON:  Pelvic radiograph dated 02/11/2014  FINDINGS: The left femur is intact. There is no dislocation. There is marked diastases of the symphysis pubis and of the left sacroiliac joint.  IMPRESSION: Normal left femur.   Electronically Signed   By: Geanie Cooley M.D.   On: 02/11/2014 13:10   Dg C-arm 61-120 Min  02/12/2014   CLINICAL DATA:  Pelvic fracture  EXAM: DG C-ARM 61-120 MIN  COMPARISON:  None.  FINDINGS: Fluoroscopy was utilized by the clinician. Fluoroscopy time was 1 min and 26 seconds. Plate and screws across the pubic symphysis are noted. SI joint screws are also present.  IMPRESSION: Fluoroscopy utilization.   Electronically Signed   By: Maryclare Bean M.D.   On: 02/12/2014 11:58    Anti-infectives: Anti-infectives   Start     Dose/Rate Route Frequency Ordered Stop   02/12/14 1600  ceFAZolin (ANCEF) IVPB 1 g/50 mL premix     1 g 100 mL/hr over 30 Minutes Intravenous 3 times per day 02/12/14 1432 02/13/14 0541   02/12/14 0800  ceFAZolin (ANCEF) IVPB 2 g/50 mL premix     2 g 100 mL/hr over 30 Minutes Intravenous  Once 02/11/14 2032 02/12/14 0847       Assessment/Plan: Fall  Concussion  Multiple pelvic fxs s/p SI screws -- Mobilization per Dr. Carola Frost, NWB LLE  ABL anemia -- no need to check again Multiple medical problems -- Home meds  FEN -- Keep foley in place until mobile, maybe out tomorrow VTE -- SCD's, Lovenox  Dispo -- PT/OT    Edwin Schneider 02/13/2014

## 2014-02-14 DIAGNOSIS — D62 Acute posthemorrhagic anemia: Secondary | ICD-10-CM

## 2014-02-14 MED ORDER — ALUM & MAG HYDROXIDE-SIMETH 200-200-20 MG/5ML PO SUSP
30.0000 mL | Freq: Four times a day (QID) | ORAL | Status: DC | PRN
  Administered 2014-02-14 (×2): 30 mL via ORAL
  Filled 2014-02-14 (×2): qty 30

## 2014-02-14 MED ORDER — ALUM & MAG HYDROXIDE-SIMETH 200-200-20 MG/5ML PO SUSP
30.0000 mL | Freq: Four times a day (QID) | ORAL | Status: DC | PRN
Start: 2014-02-14 — End: 2014-02-14

## 2014-02-14 MED ORDER — BISACODYL 10 MG RE SUPP: 10.0000 mg | Freq: Once | RECTAL | Status: DC

## 2014-02-14 NOTE — Progress Notes (Signed)
Subjective: 2 Days Post-Op Procedure(s) (LRB): OPEN REDUCTION INTERNAL FIXATION (ORIF) PELVIC RING (N/A) Patient reports pain as 2 on 0-10 scale.  Minimal pain.  Some lightheadedness when going from seated to standing position.  No nausea/vomiting.  No flatus and no bm as of yet.  As of note, patient was bearing weight on both legs with the assistance of a walker while transferring from bed to chair as I was coming in the door this am.    Objective: Vital signs in last 24 hours: Temp:  [98 F (36.7 C)-99.4 F (37.4 C)] 98.7 F (37.1 C) (08/09 0513) Pulse Rate:  [86-102] 100 (08/09 0513) Resp:  [16-18] 18 (08/09 0513) BP: (113-140)/(75-93) 140/93 mmHg (08/09 0513) SpO2:  [92 %-96 %] 93 % (08/09 0513)  Intake/Output from previous day: 08/08 0701 - 08/09 0700 In: 1390 [P.O.:1240; IV Piggyback:150] Out: 1225 [Urine:1225] Intake/Output this shift:     Recent Labs  02/11/14 1136 02/12/14 0220 02/13/14 0515  HGB 14.0 12.9* 11.8*    Recent Labs  02/12/14 0220 02/13/14 0515  WBC 9.1 9.5  RBC 4.32 3.96*  HCT 38.0* 35.1*  PLT 181 175    Recent Labs  02/11/14 1136 02/12/14 0220  NA 143 137  K 4.5 4.5  CL 109 102  CO2 22 23  BUN 19 15  CREATININE 1.15 1.11  GLUCOSE 109* 100*  CALCIUM 9.1 8.8   No results found for this basename: LABPT, INR,  in the last 72 hours  Neurologically intact Neurovascular intact Sensation intact distally Intact pulses distally Dorsiflexion/Plantar flexion intact Compartment soft Negative homen's bilaterally  Assessment/Plan: 2 Days Post-Op Procedure(s) (LRB): OPEN REDUCTION INTERNAL FIXATION (ORIF) PELVIC RING (N/A) Advance diet Up with therapy NWB LLE- ROM OK ABLA-will continue to follow D/c foley catheter today  ANTON, M. LINDSEY 02/14/2014, 10:05 AM

## 2014-02-14 NOTE — Progress Notes (Signed)
Physical Therapy Treatment Patient Details Name: Edwin Schneider MRN: [id number] DOB: February 21, 1956 Today's Date: 02/14/2014    History of Present Illness 58 y.o. male s/p OPEN REDUCTION INTERNAL FIXATION (ORIF) PELVIC RING  Hx of gout and thyroid disease.    PT Comments    Patient is progressing well towards physical therapy goals, ambulating up to 25 feet at min assist level with a rolling walker. Able to safely maintain NWB on LLE with transfers and ambulation. Motivated to improve function and will greatly benefit from continued physical therapy at home with HHPT upon d/c. Will continue to work with patient acutely focusing on safety with gait, stairs, and transfers.   Follow Up Recommendations  Home health PT;Supervision for mobility/OOB     Equipment Recommendations  3in1 (PT);Rolling walker with 5" wheels    Recommendations for Other Services OT consult     Precautions / Restrictions Precautions Precautions: Fall Restrictions Weight Bearing Restrictions: Yes LLE Weight Bearing: Non weight bearing    Mobility  Bed Mobility                  Transfers Overall transfer level: Needs assistance Equipment used: Rolling walker (2 wheeled) Transfers: Sit to/from Stand Sit to Stand: Min assist         General transfer comment: Min assist for boost from recliner. VC for hand and RLE placement. Able to safely perform with NWB on LLE.  Ambulation/Gait Ambulation/Gait assistance: Min assist Ambulation Distance (Feet): 25 Feet Assistive device: Rolling walker (2 wheeled) Gait Pattern/deviations: Step-to pattern;Decreased step length - right;Shuffle   Gait velocity interpretation: Below normal speed for age/gender General Gait Details: Educated on safe DME use. Min assist for walker control. Encouraged to use UEs to allow forward step with RLE, but only able to shuffle foot due to pain. Demonstrates ability to maintain NWB on LLE.   Stairs             Wheelchair Mobility    Modified Rankin (Stroke Patients Only)       Balance                                    Cognition Arousal/Alertness: Awake/alert Behavior During Therapy: WFL for tasks assessed/performed Overall Cognitive Status: Within Functional Limits for tasks assessed                      Exercises General Exercises - Lower Extremity Ankle Circles/Pumps: AROM;Both;10 reps;Seated Quad Sets: AROM;Left;10 reps;Seated Gluteal Sets: Strengthening;Both;Seated;5 reps Long Arc Quad: AROM;Left;10 reps;Seated Heel Slides: AAROM;Left;10 reps;Seated    General Comments        Pertinent Vitals/Pain Pain Assessment: 0-10 Pain Score: 3  Pain Location: Pelvis Pain Descriptors / Indicators: Constant Pain Intervention(s): Limited activity within patient's tolerance;Monitored during session;Repositioned    Home Living                      Prior Function            PT Goals (current goals can now be found in the care plan section) Acute Rehab PT Goals Patient Stated Goal: Get better PT Goal Formulation: With patient Time For Goal Achievement: 02/20/14 Potential to Achieve Goals: Good Progress towards PT goals: Progressing toward goals    Frequency  Min 5X/week    PT Plan Current plan remains appropriate    Co-evaluation  End of Session   Activity Tolerance: Patient tolerated treatment well Patient left: in chair;with call bell/phone within reach;with family/visitor present     Time: [id number] PT Time Calculation (min): 31 min  Charges:  $Gait Training: 8-22 mins $Therapeutic Exercise: 8-22 mins                    G Codes:      [employer] Hamza Empson, [place]PT [phone]  Berton MountBarbour, Avari Gelles S 02/14/2014, 1:53 PM

## 2014-02-14 NOTE — Progress Notes (Signed)
Continuing to work with PT.  This patient has been seen and I agree with the findings and treatment plan.  Marta LamasJames O. Gae BonWyatt, III, MD, FACS [phone] (pager) [phone] (direct pager) Trauma Surgeon

## 2014-02-14 NOTE — Progress Notes (Signed)
Patient ID: Edwin Schneider, male   DOB: [birth year], 58 y.o.   MRN: [id number]     [facility]      [address]   [place], [place] [id number]    Phone: [phone] FAX: [phone]     Subjective: Constipated.  Otherwise no issues.   Objective:  Vital signs:  Filed Vitals:   02/13/14 1051 02/13/14 1425 02/13/14 2233 02/14/14 0513  BP: 135/75 129/76 113/78 140/93  Pulse: 93 86 102 100  Temp: 99.4 F (37.4 C) 98 F (36.7 C) 98.9 F (37.2 C) 98.7 F (37.1 C)  TempSrc: Oral Oral Oral Oral  Resp: 18 18 16 18   Height:      Weight:      SpO2: 96% 96% 92% 93%    Last BM Date: 02/11/14  Intake/Output   Yesterday:  08/08 0701 - 08/09 0700 In: 1390 [P.O.:1240; IV Piggyback:150] Out: 1225 [Urine:1225] This shift: I/O last 3 completed shifts: In: 1630 [P.O.:1480; IV Piggyback:150] Out: 4375 [Urine:4375]     Physical Exam: General: Pt awake/alert/oriented x4 in no acute distress Chest: cta. No chest wall pain w good excursion CV:  Pulses intact.  Regular rhythm Abdomen: Soft.  distended.  +BS, non tender. .  No evidence of peritonitis.  No incarcerated hernias. Ext:  SCDs BLE.  No mjr edema.  No cyanosis Skin: pelvic dressing c/d/i.    Problem List:   Active Problems:   Multiple pelvic fractures    Results:   Labs: Results for orders placed during the hospital encounter of 02/11/14 (from the past 48 hour(s))  CBC     Status: Abnormal   Collection Time    02/13/14  5:15 AM      Result Value Ref Range   WBC 9.5  4.0 - 10.5 K/uL   RBC 3.96 (*) 4.22 - 5.81 MIL/uL   Hemoglobin 11.8 (*) 13.0 - 17.0 g/dL   HCT 33.235.1 (*) 95.139.0 - 88.452.0 %   MCV 88.6  78.0 - 100.0 fL   MCH 29.8  26.0 - 34.0 pg   MCHC 33.6  30.0 - 36.0 g/dL   RDW 16.613.5  06.311.5 - 01.615.5 %   Platelets 175  150 - 400 K/uL    Imaging / Studies: Dg Si Joints  02/12/2014   CLINICAL DATA:  Previous trauma  EXAM: BILATERAL SACROILIAC JOINTS - 3+ VIEW   COMPARISON:  02/11/2014  FINDINGS: Multiple spot films were obtained intraoperatively and reveal fixation of the pubic symphysis as well as pinning of the sacroiliac joints. The previously seen diastases has been reduced. 1 min and 26 seconds of fluoroscopy was utilized.   Electronically Signed   By: Alcide CleverMark  Lukens M.D.   On: 02/12/2014 12:00   Dg Pelvis Comp Min 3v  02/12/2014   CLINICAL DATA:  Status post sacroiliac joint pinning and pubic symphysis fixation  EXAM: JUDET PELVIS - 3+ VIEW  COMPARISON:  Films from earlier in the same day, 02/11/2014  FINDINGS: A fixation sideplate is noted along the superior aspect of the pubic symphysis extending into the superior pubic rami bilaterally. The previously seen diastases has been reduced. Two fixation screws are noted traversing the sacroiliac joint on the left. The longer of the screws extends across the sacroiliac joint on the right as well. There has been significant reduction in the SI joints separation on the left when compared with the prior exam.   Electronically Signed   By: Eulah PontMark  Lukens M.D.  On: 02/12/2014 13:42   Dg C-arm 61-120 Min  02/12/2014   CLINICAL DATA:  Pelvic fracture  EXAM: DG C-ARM 61-120 MIN  COMPARISON:  None.  FINDINGS: Fluoroscopy was utilized by the clinician. Fluoroscopy time was 1 min and 26 seconds. Plate and screws across the pubic symphysis are noted. SI joint screws are also present.  IMPRESSION: Fluoroscopy utilization.   Electronically Signed   By: Maryclare Bean M.D.   On: 02/12/2014 11:58    Medications / Allergies:  Scheduled Meds: . acetaminophen  1,000 mg Oral 3 times per day  . allopurinol  300 mg Oral Daily  . bisacodyl  10 mg Rectal Once  . docusate sodium  100 mg Oral BID  . enoxaparin (LOVENOX) injection  30 mg Subcutaneous Q12H  . levothyroxine  88 mcg Oral QAC breakfast  . pantoprazole  40 mg Oral Daily  . polyethylene glycol  17 g Oral Daily   Continuous Infusions: . 0.45 % NaCl with KCl 20 mEq / L 50 mL/hr  at 02/12/14 1803   PRN Meds:.bisacodyl, morphine injection, ondansetron (ZOFRAN) IV, ondansetron, oxyCODONE  Antibiotics: Anti-infectives   Start     Dose/Rate Route Frequency Ordered Stop   02/12/14 1600  ceFAZolin (ANCEF) IVPB 1 g/50 mL premix     1 g 100 mL/hr over 30 Minutes Intravenous 3 times per day 02/12/14 1432 02/13/14 0541   02/12/14 0800  ceFAZolin (ANCEF) IVPB 2 g/50 mL premix     2 g 100 mL/hr over 30 Minutes Intravenous  Once 02/11/14 2032 02/12/14 0847      Assessment/Plan:  Fall  Concussion  Multiple pelvic fxs s/p SI screws -- Mobilization per Dr. Carola Frost, NWB LLE  ABL anemia -- stable Multiple medical problems -- Home meds  FEN -- DC foley.  Has an ileus, give dulcolax.   VTE -- SCD's, Lovenox  Dispo -- PT/OT, anticipate discharge home soon.  Home health PT/OT, supervision   Ashok Norris, [facility] Pager [phone]   02/14/2014 9:52 AM

## 2014-02-14 NOTE — Progress Notes (Signed)
Pt was in recliner for several hours and had significant pain when moving from chair to bed. He was unable to push himself into a standing position and required a two person assist to stand. He moved very slowly and with a lot of pain and then required total assistance getting into the bed. Pain was unrelieved by oxycodone 15mg  and required IV morphine for pain relief.

## 2014-02-15 ENCOUNTER — Encounter (HOSPITAL_COMMUNITY): Payer: Self-pay | Admitting: Orthopedic Surgery

## 2014-02-15 ENCOUNTER — Inpatient Hospital Stay (HOSPITAL_COMMUNITY): Payer: BC Managed Care – PPO

## 2014-02-15 LAB — TYPE AND SCREEN
ABO/RH(D): A POS
Antibody Screen: NEGATIVE
UNIT DIVISION: 0
Unit division: 0

## 2014-02-15 MED ORDER — MAGNESIUM HYDROXIDE 400 MG/5ML PO SUSP
30.0000 mL | Freq: Every day | ORAL | Status: DC
  Administered 2014-02-15 – 2014-02-17 (×3): 30 mL via ORAL
  Filled 2014-02-15 (×4): qty 30

## 2014-02-15 NOTE — Progress Notes (Signed)
Rehab Admissions Coordinator Note:  Patient was screened by Trish MageLogue, Lealand Elting M for appropriateness for an Inpatient Acute Rehab Consult.  At this time, we are recommending Inpatient Rehab consult.  Lelon FrohlichLogue, Desmin Daleo M 02/15/2014, 1:48 PM  I can be reached at [phone].

## 2014-02-15 NOTE — Discharge Instructions (Signed)
[facility] Discharge Instructions   General Discharge Instructions  WEIGHT BEARING STATUS: Nonweightbearing Left leg, Weightbearing as tolerated Right Leg  RANGE OF MOTION/ACTIVITY: Range of motion as tolerated Bilateral lower extremities, activity as tolerated while maintaining weightbearing restrictions   Diet: as you were eating previously.  Can use over the counter stool softeners and bowel preparations, such as Miralax, to help with bowel movements.  Narcotics can be constipating.  Be sure to drink plenty of fluids  STOP SMOKING OR USING NICOTINE PRODUCTS!!!!  As discussed nicotine severely impairs your body's ability to heal surgical and traumatic wounds but also impairs bone healing.  Wounds and bone heal by forming microscopic blood vessels (angiogenesis) and nicotine is a vasoconstrictor (essentially, shrinks blood vessels).  Therefore, if vasoconstriction occurs to these microscopic blood vessels they essentially disappear and are unable to deliver necessary nutrients to the healing tissue.  This is one modifiable factor that you can do to dramatically increase your chances of healing your injury.    (This means no smoking, no nicotine gum, patches, etc)  DO NOT USE NONSTEROIDAL ANTI-INFLAMMATORY DRUGS (NSAID'S)  Using products such as Advil (ibuprofen), Aleve (naproxen), Motrin (ibuprofen) for additional pain control during fracture healing can delay and/or prevent the healing response.  If you would like to take over the counter (OTC) medication, Tylenol (acetaminophen) is ok.  However, some narcotic medications that are given for pain control contain acetaminophen as well. Therefore, you should not exceed more than 4000 mg of tylenol in a day if you do not have liver disease.  Also note that there are may OTC medicines, such as cold medicines and allergy medicines that my contain tylenol as well.  If you have any questions about medications and/or interactions please ask  your doctor/PA or your pharmacist.   PAIN MEDICATION USE AND EXPECTATIONS  You have likely been given narcotic medications to help control your pain.  After a traumatic event that results in an fracture (broken bone) with or without surgery, it is ok to use narcotic pain medications to help control one's pain.  We understand that everyone responds to pain differently and each individual patient will be evaluated on a regular basis for the continued need for narcotic medications. Ideally, narcotic medication use should last no more than 6-8 weeks (coinciding with fracture healing).   As a patient it is your responsibility as well to monitor narcotic medication use and report the amount and frequency you use these medications when you come to your office visit.   We would also advise that if you are using narcotic medications, you should take a dose prior to therapy to maximize you participation.  IF YOU ARE ON NARCOTIC MEDICATIONS IT IS NOT PERMISSIBLE TO OPERATE A MOTOR VEHICLE (MOTORCYCLE/CAR/TRUCK/MOPED) OR HEAVY MACHINERY DO NOT MIX NARCOTICS WITH OTHER CNS (CENTRAL NERVOUS SYSTEM) DEPRESSANTS SUCH AS ALCOHOL       ICE AND ELEVATE INJURED/OPERATIVE EXTREMITY  Using ice and elevating the injured extremity above your heart can help with swelling and pain control.  Icing in a pulsatile fashion, such as 20 minutes on and 20 minutes off, can be followed.    Do not place ice directly on skin. Make sure there is a barrier between to skin and the ice pack.    Using frozen items such as frozen peas works well as the conform nicely to the are that needs to be iced.  USE AN ACE WRAP OR TED HOSE FOR SWELLING CONTROL  In addition to  icing and elevation, Ace wraps or TED hose are used to help limit and resolve swelling.  It is recommended to use Ace wraps or TED hose until you are informed to stop.    When using Ace Wraps start the wrapping distally (farthest away from the body) and wrap proximally (closer  to the body)   Example: If you had surgery on your leg or thing and you do not have a splint on, start the ace wrap at the toes and work your way up to the thigh        If you had surgery on your upper extremity and do not have a splint on, start the ace wrap at your fingers and work your way up to the upper arm  IF YOU ARE IN A SPLINT OR CAST DO NOT REMOVE IT FOR ANY REASON   If your splint gets wet for any reason please contact the office immediately. You may shower in your splint or cast as long as you keep it dry.  This can be done by wrapping in a cast cover or garbage back (or similar)  Do Not stick any thing down your splint or cast such as pencils, money, or hangers to try and scratch yourself with.  If you feel itchy take benadryl as prescribed on the bottle for itching  IF YOU ARE IN A CAM BOOT (BLACK BOOT)  You may remove boot periodically. Perform daily dressing changes as noted below.  Wash the liner of the boot regularly and wear a sock when wearing the boot. It is recommended that you sleep in the boot until told otherwise  CALL THE OFFICE WITH ANY QUESTIONS OR CONCERTS: [phone]    Discharge Wound Care Instructions  Do NOT apply any ointments, solutions or lotions to pin sites or surgical wounds.  These prevent needed drainage and even though solutions like hydrogen peroxide kill bacteria, they also damage cells lining the pin sites that help fight infection.  Applying lotions or ointments can keep the wounds moist and can cause them to breakdown and open up as well. This can increase the risk for infection. When in doubt call the office.  Surgical incisions should be dressed daily.  If any drainage is noted, use one layer of adaptic, then gauze, Kerlix, and an ace wrap.  Once the incision is completely dry and without drainage, it may be left open to air out.  Showering may begin 36-48 hours later.  Cleaning gently with soap and water.  Traumatic wounds should be dressed  daily as well.    One layer of adaptic, gauze, Kerlix, then ace wrap.  The adaptic can be discontinued once the draining has ceased    If you have a wet to dry dressing: wet the gauze with saline the squeeze as much saline out so the gauze is moist (not soaking wet), place moistened gauze over wound, then place a dry gauze over the moist one, followed by Kerlix wrap, then ace wrap.

## 2014-02-15 NOTE — Progress Notes (Signed)
Physical Therapy Treatment Patient Details Name: Edwin Schneider MRN: [id number] DOB: 1955-08-01 Today's Date: 02/15/2014    History of Present Illness 58 y.o. male s/p OPEN REDUCTION INTERNAL FIXATION (ORIF) PELVIC RING  Hx of gout and thyroid disease.    PT Comments    [name] is progressing slowly towards physical therapy goals. He is having more difficulty following instructions today and needed assistance to maintain NWB on LLE (this was not an issue in previous therapy sessions). He reports feeling "a little confused" today and states his pain is worse, with new onset of abdominal discomfort. His mobility is not progressing as rapidly as we had anticipated and family is concerned with their ability to care for him at home. Stair training is not appropriate due to safety. Reinforced non weight-bearing precautions throughout therapy session. At this time recommending CIR screen/consult. Pt will not be safe to return home with HHPT in this condition. Will continue to follow acutely and update disposition recommendations as necessary.  Follow Up Recommendations  CIR     Equipment Recommendations  3in1 (PT);Rolling walker with 5" wheels    Recommendations for Other Services OT consult;Rehab consult     Precautions / Restrictions Precautions Precautions: Fall Restrictions Weight Bearing Restrictions: Yes LLE Weight Bearing: Non weight bearing    Mobility  Bed Mobility                  Transfers Overall transfer level: Needs assistance Equipment used: Rolling walker (2 wheeled) Transfers: Sit to/from Stand Sit to Stand: Min assist         General transfer comment: Min assist for boost from recliner and BSC x2. Required assist to maintain NWB on LLE today with max verbal cues. This was not an issue yesterday. VC for hand placement and technique to stand/sit.  Ambulation/Gait Ambulation/Gait assistance: Min assist Ambulation Distance (Feet): 15 Feet  (x2) Assistive device: Rolling walker (2 wheeled) Gait Pattern/deviations: Shuffle ("hop-to" pattern)   Gait velocity interpretation: Below normal speed for age/gender General Gait Details: Focused on taking single step forward with cues for increased UE use. Continues to shuffle right LE forward but slowly emerging abiblity to take step. Arms fatigue quickly and pt reports slight left shoulder pain with RW use. Min assist for walker placement and to maintain NWB on LLE.   Stairs Stairs:  (Unsafe to perform this task at this time)          Wheelchair Mobility    Modified Rankin (Stroke Patients Only)       Balance                                    Cognition Arousal/Alertness: Awake/alert Behavior During Therapy: WFL for tasks assessed/performed Overall Cognitive Status: Impaired/Different from baseline Area of Impairment: Memory;Following commands;Safety/judgement     Memory: Decreased recall of precautions;Decreased short-term memory Following Commands: Follows multi-step commands inconsistently;Follows one step commands inconsistently Safety/Judgement: Decreased awareness of safety     General Comments: Pt having some difficulty follow and understanding directions today. This was not an issue in previous therapy sessions    Exercises General Exercises - Lower Extremity Ankle Circles/Pumps: AROM;Both;10 reps;Seated Quad Sets: 10 reps;Seated;Both;Strengthening Gluteal Sets: Strengthening;Both;Seated;10 reps Long Arc Quad: Left;10 reps;Seated;Strengthening    General Comments General comments (skin integrity, edema, etc.): Patient having more difficulty following commands and remembering WB status today. This was not an issue with therapy yesterday. Pt  states he feels "a little confused." Oriented x4. Reviewed weight bearing status intermittently throughout therapy session.      Pertinent Vitals/Pain Pain Assessment: 0-10 Pain Score: 3  Pain  Location: Pelvis Pain Intervention(s): Limited activity within patient's tolerance;Monitored during session;Repositioned    Home Living                      Prior Function            PT Goals (current goals can now be found in the care plan section) Acute Rehab PT Goals PT Goal Formulation: With patient Time For Goal Achievement: 02/20/14 Potential to Achieve Goals: Good Progress towards PT goals: Progressing toward goals    Frequency  Min 5X/week    PT Plan Discharge plan needs to be updated    Co-evaluation             End of Session   Activity Tolerance: Patient limited by fatigue Patient left: in chair;with call bell/phone within reach     Time: 1131-1156 PT Time Calculation (min): 25 min  Charges:  $Gait Training: 8-22 mins $Therapeutic Exercise: 8-22 mins                    G Codes:      [employer], [place] [phone]  Berton Mount 02/15/2014, 12:51 PM

## 2014-02-15 NOTE — Progress Notes (Signed)
Orthopaedic Trauma Service Progress Note  Subjective  Doing well Denies pain Notes abdominal distension  + Flatus and small BM  Pt also mentions hx of gastric ulcer   Pt was up in bathroom on arrival this am Wife helping pt Not completely sure pt keeping all his weight off of Left Leg      Objective   BP 142/97  Pulse 90  Temp(Src) 98.3 F (36.8 C) (Oral)  Resp 18  Ht 5\' 8"  (1.727 m)  Wt 90.8 kg (200 lb 2.8 oz)  BMI 30.44 kg/m2  SpO2 98%  Intake/Output     08/09 0701 - 08/10 0700 08/10 0701 - 08/11 0700   P.O. 200    IV Piggyback     Total Intake(mL/kg) 200 (2.2)    Urine (mL/kg/hr)     Total Output       Net +200          Urine Occurrence 2 x       Exam  Gen: awake and alert, NAD Lungs: clear anterior fields Cardiac: RRR Abd: distended, NT, + BS  Pelvis: Incisions look pristine, no signs of infection, no drainage  Ext:      Bilateral Lower Extremities  Distal motor and sensory functions intact  Ext warm   + DP pulses     Assessment and Plan   POD/HD#: 893  58 y/o Hispanic male s/p fall from height with pelvic ring fx  1. Fall  2. VS pelvic Ring fx, with L hemipelvis dislocation s/p L SI screw x 2 and ORIF pubic symphysis  NWB L Leg x 6-8 weeks  WBAT R Leg  NO ROM restrictions  PT/OT  Ice prn  Dressing changes PRN   3. DVT/PE prophylaxis  Recommend lovenox 40 mg sq daily x 4 weeks  4. FEN  Ileus    Pt without significant abdominal pain    + flatus and small BM    Continue to monitor   5. Dispo  Ortho issues stable  Could consider dc today or observe ileus another day   Follow up with ortho in 10-14 days  Will check plain xrays before dc     Mearl LatinKeith W. Aldrick Derrig, PA-C [facility] [phone] (P) 02/15/2014 8:28 AM  **Disclaimer: This note may have been dictated with voice recognition software. Similar sounding words can inadvertently be transcribed and this note may contain transcription errors which may not have  been corrected upon publication of note.**

## 2014-02-15 NOTE — Progress Notes (Signed)
OT Cancellation Note  Patient Details Name: Sharma CovertUriel D Schey MRN: [id number] DOB: 08/07/1955   Cancelled Treatment:    Reason Eval/Treat Not Completed: Patient at procedure or test/ unavailable. Pt off floor, down for test.  Evette GeorgesLeonard, Lindsie Simar Eva [phone] 02/15/2014, 9:06 AM

## 2014-02-15 NOTE — Progress Notes (Signed)
Occupational Therapy Treatment Patient Details Name: Edwin Schneider MRN: [id number] DOB: 1956-05-12 Today's Date: 02/15/2014    History of present illness 58 y.o. male s/p OPEN REDUCTION INTERNAL FIXATION (ORIF) PELVIC RING  Hx of gout and thyroid disease.   OT comments  This 58 yo male making progress with today's session focusing on use of AE for LBB/D and toilet transfers. Pt is still moving slowly and is having difficulty with [place] for LLE thus feel he would benefit from inpatient rehab. Acute OT will continue to follow.  Follow Up Recommendations  CIR    Equipment Recommendations  3 in 1 bedside comode       Precautions / Restrictions Precautions Precautions: Fall Restrictions Weight Bearing Restrictions: Yes LLE Weight Bearing: Non weight bearing       Mobility Bed Mobility Overal bed mobility: Needs Assistance Bed Mobility: Supine to Sit     Supine to sit: Min assist (for LLE)        Transfers Overall transfer level: Needs assistance Equipment used: Rolling walker (2 wheeled) Transfers: Sit to/from Stand Sit to Stand: Min assist Stand pivot transfers: Min assist       General transfer comment: Max VCs to not put weight through LLE (pt reported he was not, that he was just putting his toes down)    Balance Overall balance assessment: Needs assistance Sitting-balance support: No upper extremity supported;Feet supported Sitting balance-Leahy Scale: Fair     Standing balance support: Bilateral upper extremity supported Standing balance-Leahy Scale: Poor                     ADL Overall ADL's : Needs assistance/impaired             Lower Body Bathing: Moderate assistance;Sit to/from stand       Lower Body Dressing: With adaptive equipment;Sit to/from stand;Moderate assistance (use of reacher to doff socks, don underwear, don socks)   Toilet Transfer: Minimal assistance;Stand-pivot;RW (bed>recliner going to pt's right)    Toileting- Clothing Manipulation and Hygiene: Moderate assistance;Sit to/from stand         General ADL Comments: Issued pt AE kit                Cognition   Behavior During Therapy: WFL for tasks assessed/performed Overall Cognitive Status: Impaired/Different from baseline Area of Impairment: Memory;Following commands;Safety/judgement     Memory: Decreased recall of precautions;Decreased short-term memory  Following Commands: Follows multi-step commands inconsistently;Follows one step commands inconsistently Safety/Judgement: Decreased awareness of safety     General Comments: Pt having trouble maintaining NWB'ing--"resting" toes on floor per his report (but question if more)      xercises General Exercises - Lower Extremity Ankle Circles/Pumps: AROM;Both;10 reps;Seated Quad Sets: 10 reps;Seated;Both;Strengthening Gluteal Sets: Strengthening;Both;Seated;10 reps Long Arc Quad: Left;10 reps;Seated;Strengthening           Pertinent Vitals/ Pain       Pain Assessment: 0-10 Pain Score: 3  Pain Location: pelvis (left greater than right) Pain Intervention(s): Limited activity within patient's tolerance;Monitored during session;Repositioned         Frequency Min 2X/week     Progress Toward Goals  OT Goals(current goals can now be found in the care plan section)  Progress towards OT goals: Progressing toward goals     Plan Discharge plan needs to be updated       End of Session Equipment Utilized During Treatment: Gait belt;Rolling walker   Activity Tolerance Patient tolerated treatment well   Patient Left  in chair;with call bell/phone within reach           Time: 0958-1033 OT Time Calculation (min): 35 min  Charges: OT General Charges $OT Visit: 1 Procedure OT Evaluation $Initial OT Evaluation Tier I: 1 Procedure OT Treatments $Self Care/Home Management : 23-37 mins  Almon Register [phone] 02/15/2014, 1:06 PM

## 2014-02-15 NOTE — Progress Notes (Signed)
Patient ID: Edwin Schneider, male   DOB: January 24, 1956, 58 y.o.   MRN: [id number] 3 Days Post-Op  Subjective: Feels distended, not eating a lot, is passing gas and had BM today and yesterday  Objective: Vital signs in last 24 hours: Temp:  [98.3 F (36.8 C)-99.1 F (37.3 C)] 98.3 F (36.8 C) (08/10 0621) Pulse Rate:  [89-92] 90 (08/10 0621) Resp:  [16-18] 18 (08/10 0621) BP: (113-142)/(73-97) 142/97 mmHg (08/10 0621) SpO2:  [95 %-98 %] 98 % (08/10 0621) Last BM Date: 02/14/14  Intake/Output from previous day: 08/09 0701 - 08/10 0700 In: 200 [P.O.:200] Out: -  Intake/Output this shift: Total I/O In: 240 [P.O.:240] Out: -   General appearance: alert and cooperative Resp: clear to auscultation bilaterally Cardio: regular rate and rhythm GI: soft but distended, NT, some BS Extremities: PAS, calves soft  Lab Results: CBC   Recent Labs  02/13/14 0515  WBC 9.5  HGB 11.8*  HCT 35.1*  PLT 175   BMET No results found for this basename: NA, K, CL, CO2, GLUCOSE, BUN, CREATININE, CALCIUM,  in the last 72 hours PT/INR No results found for this basename: LABPROT, INR,  in the last 72 hours ABG No results found for this basename: PHART, PCO2, PO2, HCO3,  in the last 72 hours  Studies/Results: Dg Pelvis Comp Min 3v  02/15/2014   CLINICAL DATA:  Follow-up of pelvic ring fixation.  EXAM: JUDET PELVIS - 3+ VIEW  COMPARISON:  February 12, 2014 immediately postoperative study semi: Pre fixation film of February 11, 2014.  FINDINGS: Again demonstrated are two fixation screws through the left SI joint with the longer of the screws extending across the midline to engage the medial aspect of the right iliac bone. The lower screw through the left SI joint extends to the mid sacrum and is stable. The fixation plate traversing the superior pubic rami bilaterally is unchanged. There has not been any change in the degree of diastases of the pubic symphysis nor of the SI joints since the previous post  fixation study.  IMPRESSION: Stable appearance of the pelvis since the immediate post fixation study of February 12, 2014.   Electronically Signed   By: David  [place]   On: 02/15/2014 09:20    Anti-infectives: Anti-infectives   Start     Dose/Rate Route Frequency Ordered Stop   02/12/14 1600  ceFAZolin (ANCEF) IVPB 1 g/50 mL premix     1 g 100 mL/hr over 30 Minutes Intravenous 3 times per day 02/12/14 1432 02/13/14 0541   02/12/14 0800  ceFAZolin (ANCEF) IVPB 2 g/50 mL premix     2 g 100 mL/hr over 30 Minutes Intravenous  Once 02/11/14 2032 02/12/14 0847      Assessment/Plan: Fall  Concussion  Multiple pelvic fxs s/p SI screws -- Mobilization per Dr. Carola FrostHandy, NWB LLE  ABL anemia -- stable Multiple medical problems -- Home meds  FEN -- DC foley.  Has an ileus, add milk of mag   VTE -- SCD's, Lovenox  Dispo -- await improvement in ileus, daughter took me aside and says his wife cannot handle him at home and they would like him to go to SNF for rehab for a short time - 1-2 weeks. I will ask CSW to work on it.  LOS: 4 days    Violeta GelinasBurke Alane Hanssen, MD, MPH, FACS Trauma: [phone] General Surgery: [phone]  02/15/2014

## 2014-02-16 ENCOUNTER — Encounter (HOSPITAL_COMMUNITY): Payer: Self-pay | Admitting: Physical Medicine and Rehabilitation

## 2014-02-16 DIAGNOSIS — S060XAA Concussion with loss of consciousness status unknown, initial encounter: Secondary | ICD-10-CM

## 2014-02-16 DIAGNOSIS — S329XXA Fracture of unspecified parts of lumbosacral spine and pelvis, initial encounter for closed fracture: Secondary | ICD-10-CM

## 2014-02-16 DIAGNOSIS — S060X9A Concussion with loss of consciousness of unspecified duration, initial encounter: Secondary | ICD-10-CM

## 2014-02-16 DIAGNOSIS — K567 Ileus, unspecified: Secondary | ICD-10-CM | POA: Diagnosis not present

## 2014-02-16 DIAGNOSIS — W19XXXA Unspecified fall, initial encounter: Secondary | ICD-10-CM

## 2014-02-16 DIAGNOSIS — K9189 Other postprocedural complications and disorders of digestive system: Secondary | ICD-10-CM

## 2014-02-16 NOTE — Progress Notes (Signed)
Patient ID: Edwin Schneider, male   DOB: Aug 29, 1955, 58 y.o.   MRN: [id number]   LOS: 5 days   Subjective: No new c/o, still having abd distension. Pain well managed.   Objective: Vital signs in last 24 hours: Temp:  [97.9 F (36.6 C)-99.3 F (37.4 C)] 98.3 F (36.8 C) (08/11 0555) Pulse Rate:  [82-94] 82 (08/11 0555) Resp:  [16] 16 (08/10 1300) BP: (136-143)/(82-91) 136/91 mmHg (08/11 0555) SpO2:  [95 %-100 %] 96 % (08/11 0555) Last BM Date: 02/16/14   Physical Exam General appearance: alert and no distress Resp: clear to auscultation bilaterally Cardio: regular rate and rhythm GI: Soft, mild diffuse TTP, mild-to-mod distension, +BS   Assessment/Plan: Fall  Concussion  Multiple pelvic fxs s/p SI screws -- Mobilization per Dr. Carola FrostHandy, NWB LLE  ABL anemia -- stable  Multiple medical problems -- Home meds  FEN -- POD#4, if ileus persists for another day or two would consider prokinetic. VTE -- SCD's, Lovenox  Dispo -- OK for d/c to CIR when bed available, expect partial ileus to resolve in the next few days.     Freeman CaldronMichael J. Debria Broecker, PA-C Pager: [phone] General Trauma PA Pager: [phone]  02/16/2014

## 2014-02-16 NOTE — Consult Note (Signed)
Physical Medicine and Rehabilitation Consult  Reason for Consult: Pelvic fracture Referring Physician: Dr. Grandville Silos   HPI: Edwin Schneider is a 58 y.o. male who was admitted on 02/11/14 past fall off a ladder. He landed on his back/buttocks with brief LOC but able to recall details of event and was unable  to move due to pain. X rays revealed disruption of SI joint and symphysis pubis with distraction of left hemipelvis, possible small fracture of inferior margin of ileum.  Urethrography showed tapering and abrupt narrowing of urethra with question of severe stretch injury, extrinsic compression of the proximal bulbar urethra from trauma, and/or severe spasm of the external urethral sphincter--no bladder injury. He was evaluated by Dr. Erlinda Hong and traction pin placed in ED. Patient with urinary retention and cystoscopy done with foley placement  by Dr. Karsten Ro. He recommended initiating voiding trial once patient mobile. Dr. Marcelino Scot consulted for input and patient underwent ORIF pelvic ring anterior pubic symphysis and bilateral SI screw fixation on 02/12/14. Post op NWB LLE and on Lovenox X 4 weeks for DVT prophylaxis. Post op ileus continues with complaints of nausea as well as abdominal distension. Therapy ongoing and patient with slow progress as well as difficulty maintaining NWB LLE (he reports that he has been TDWB). MD, PT, OT recommending CIR for progression.   Patient states he has been voiding since Foley removal. Patient states that he has been able to maintain nonweightbearing today on the left lower extremity. He states he is concerned about cost of further in hospital care and is trying to get home if possible  Review of Systems  HENT: Negative for tinnitus.   Eyes: Negative for blurred vision and double vision.  Respiratory: Negative for cough and shortness of breath.   Cardiovascular: Negative for chest pain and palpitations.  Gastrointestinal: Positive for nausea and abdominal  pain (disternsion).  Musculoskeletal: Positive for joint pain. Negative for back pain and neck pain.  Neurological: Positive for dizziness (with positional changes. ). Negative for headaches.  Psychiatric/Behavioral: Negative for depression. The patient is not nervous/anxious.     Past Medical History  Diagnosis Date  . Gout   . Thyroid disease    Past Surgical History  Procedure Laterality Date  . Knee surgery    . Orif pelvic fracture N/A 02/12/2014    Procedure: OPEN REDUCTION INTERNAL FIXATION (ORIF) PELVIC RING;  Surgeon: Rozanna Box, MD;  Location: [facility];  Service: Orthopedics;  Laterality: N/A;   Family History  Problem Relation Age of Onset  . Cancer Mother   . Cancer Father     Social History:  Married. Wife able to provide intermittent supervision past discharge. Works as Engineer, technical sales and very active PTA. He reports that he has never smoked. He does not have any smokeless tobacco history on file. He reports that he does not drink alcohol or use illicit drugs.  Allergies: No Known Allergies  Medications Prior to Admission  Medication Sig Dispense Refill  . allopurinol (ZYLOPRIM) 300 MG tablet Take 300 mg by mouth daily.      Marland Kitchen levothyroxine (SYNTHROID, LEVOTHROID) 88 MCG tablet Take 88 mcg by mouth daily before breakfast.        Home: Home Living Family/patient expects to be discharged to:: Private residence Living Arrangements: Spouse/significant other;Children Available Help at Discharge: Family;Available 24 hours/day Type of Home: House Home Access: Stairs to enter [employer] of Steps: 3 Entrance Stairs-Rails: None Home Layout: One level Home Equipment: None  Functional History: Prior Function Level of Independence: Independent Functional Status:  Mobility: Bed Mobility Overal bed mobility: Needs Assistance Bed Mobility: Supine to Sit Supine to sit: Min assist (for LLE) General bed mobility comments: Pt sitting up in recliner when OT  arrived.  Transfers Overall transfer level: Needs assistance Equipment used: Rolling walker (2 wheeled) Transfers: Sit to/from Stand Sit to Stand: Min assist Stand pivot transfers: Min assist General transfer comment: Max VCs to not put weight through LLE (pt reported he was not, that he was just putting his toes down) Ambulation/Gait Ambulation/Gait assistance: Min assist Ambulation Distance (Feet): 15 Feet (x2) Assistive device: Rolling walker (2 wheeled) Gait Pattern/deviations: Shuffle ("hop-to" pattern) Gait velocity interpretation: Below normal speed for age/gender General Gait Details: Focused on taking single step forward with cues for increased UE use. Continues to shuffle right LE forward but slowly emerging abiblity to take step. Arms fatigue quickly and pt reports slight left shoulder pain with RW use. Min assist for walker placement and to maintain NWB on LLE. Stairs:  (Unsafe to perform this task at this time)    ADL: ADL Overall ADL's : Needs assistance/impaired Eating/Feeding: Independent;Sitting Grooming: Set up;Sitting Upper Body Bathing: Set up;Sitting Lower Body Bathing: Moderate assistance;Sit to/from stand Upper Body Dressing : Set up;Sitting Lower Body Dressing: With adaptive equipment;Sit to/from stand;Moderate assistance (use of reacher to doff socks, don underwear, don socks) Toilet Transfer: Minimal assistance;Stand-pivot;RW (bed>recliner going to pt's right) Toileting- Clothing Manipulation and Hygiene: Moderate assistance;Sit to/from stand General ADL Comments: Issued pt AE kit  Cognition: Cognition Overall Cognitive Status: Impaired/Different from baseline Orientation Level: Oriented X4 Cognition Arousal/Alertness: Awake/alert Behavior During Therapy: WFL for tasks assessed/performed Overall Cognitive Status: Impaired/Different from baseline Area of Impairment: Memory;Following commands;Safety/judgement Memory: Decreased recall of  precautions;Decreased short-term memory Following Commands: Follows multi-step commands inconsistently;Follows one step commands inconsistently Safety/Judgement: Decreased awareness of safety General Comments: Pt having trouble maintaining NWB'ing--"resting" toes on floor per his report (but question if more)  Blood pressure 136/91, pulse 82, temperature 98.3 F (36.8 C), temperature source Oral, resp. rate 16, height _0  (1.727 m), weight 90.8 kg (200 lb 2.8 oz), SpO2 96.00%. Physical Exam  Nursing note and vitals reviewed. Constitutional: He is oriented to person, place, and time. He appears well-developed and well-nourished.  HENT:  Head: Normocephalic and atraumatic.  Eyes: Conjunctivae are normal.  Neck: Normal range of motion. Neck supple.  Cardiovascular: Normal rate and regular rhythm.   No murmur heard. Respiratory: Effort normal and breath sounds normal.  GI: He exhibits distension. Bowel sounds are decreased. There is generalized tenderness. There is no rebound.  Musculoskeletal:  Left hip dressing with dry blood. Moderate edema left thigh.  Neurological: He is alert and oriented to person, place, and time.  Speech clear. Follows basic commands without difficutly.   Skin: Skin is warm and dry.  Psychiatric: He has a normal mood and affect. His behavior is normal. Thought content normal.    No results found for this or any previous visit (from the past 24 hour(s)). Dg Pelvis Comp Min 3v  02/15/2014   CLINICAL DATA:  Follow-up of pelvic ring fixation.  EXAM: JUDET PELVIS - 3+ VIEW  COMPARISON:  February 12, 2014 immediately postoperative study semi: Pre fixation film of February 11, 2014.  FINDINGS: Again demonstrated are two fixation screws through the left SI joint with the longer of the screws extending across the midline to engage the medial aspect of the right iliac bone. The lower screw through the  left SI joint extends to the mid sacrum and is stable. The fixation plate  traversing the superior pubic rami bilaterally is unchanged. There has not been any change in the degree of diastases of the pubic symphysis nor of the SI joints since the previous post fixation study.  IMPRESSION: Stable appearance of the pelvis since the immediate post fixation study of February 12, 2014.   Electronically Signed   By: David  Martinique   On: 02/15/2014 09:20    Assessment/Plan: Diagnosis: Complex pelvic fracture/dislocation status post ORIF 1. Does the need for close, 24 hr/day medical supervision in concert with the patient's rehab needs make it unreasonable for this patient to be served in a less intensive setting? Potentially 2. Co-Morbidities requiring supervision/potential complications: Concussion, pain management, constipation vs. ileus 3. Due to bladder management, bowel management, safety, skin/wound care, disease management, medication administration, pain management and patient education, does the patient require 24 hr/day rehab nursing? Potentially 4. Does the patient require coordinated care of a physician, rehab nurse, PT (1-2 hrs/day, 5 days/week) and OT (1-2 hrs/day, 5 days/week) to address physical and functional deficits in the context of the above medical diagnosis(es)? Potentially Addressing deficits in the following areas: balance, endurance, locomotion, strength, transferring, bowel/bladder control, bathing, dressing and toileting 5. Can the patient actively participate in an intensive therapy program of at least 3 hrs of therapy per day at least 5 days per week? Yes 6. The potential for patient to make measurable gains while on inpatient rehab is excellent 7. Anticipated functional outcomes upon discharge from inpatient rehab are modified independent  with PT, modified independent with OT, n/a with SLP. 8. Estimated rehab length of stay to reach the above functional goals is: 5-7 days 9. Does the patient have adequate social supports to accommodate these discharge  functional goals? Potentially 10. Anticipated D/C setting: Home 11. Anticipated post D/C treatments: [place] therapy 12. Overall Rehab/Functional Prognosis: excellent  RECOMMENDATIONS: This patient's condition is appropriate for continued rehabilitative care in the following setting: CIR Patient has agreed to participate in recommended program. No Note that insurance prior authorization may be required for reimbursement for recommended care.  Comment: Patient wishes to go home mainly for financial reasons    02/16/2014

## 2014-02-16 NOTE — Progress Notes (Signed)
WB compliance appears likely to be an issue. Noncompliance dramatically increases risk of failure, which has been discussed with the family and the patient.  Myrene GalasMichael Qasim Diveley, MD [facility] [phone] (p)

## 2014-02-16 NOTE — Care Management Note (Signed)
CARE MANAGEMENT NOTE 02/16/2014  Patient:  Edwin Schneider,Edwin Schneider   Account Number:  [id number]  Date Initiated:  02/16/2014  Documentation initiated by:  Vance PeperBRADY,Nyleah Mcginnis  Subjective/Objective Assessment:   58 yr old male s/p ORIF of pelvic ring , anterior pubic symphysis and bilateral S1 screw fixation.     Action/Plan:   Case manager discussed with patient, wife and daughter need for home health and DME at discharge. [facility]. Patient has family support at discharge.  Pateint doesnt want  SNF and CIR   Anticipated DC Date:  02/16/2014   Anticipated DC Plan:  HOME W HOME HEALTH SERVICES  In-house referral  Clinical Social Worker      DC Associate Professorlanning Services  CM consult      PAC Choice  DURABLE MEDICAL EQUIPMENT  HOME HEALTH   Choice offered to / List presented to:  C-1 Patient   DME arranged  [facility] - ROLLING  3-N-1      DME agency  [facility]     HH arranged  HH-2 PT      [facility]   Status of service:  Completed, signed off Medicare Important Message given?   (If response is "NO", the following Medicare IM given date fields will be blank) Date Medicare IM given:   Medicare IM given by:   Date Additional Medicare IM given:   Additional Medicare IM given by:    Discharge Disposition:  HOME W HOME HEALTH SERVICES  Per UR Regulation:  Reviewed for med. necessity/level of care/duration of stay

## 2014-02-16 NOTE — Progress Notes (Signed)
Physical Therapy Treatment Patient Details Name: Edwin Schneider [place] MRN: [id number] DOB: 09-Mar-1956 Today's Date: 02/16/2014    History of Present Illness 58 y.o. male s/p OPEN REDUCTION INTERNAL FIXATION (ORIF) PELVIC RING  Hx of gout and thyroid disease.    PT Comments    Patient progressing well today towards physical therapy goals. Much more receptive to instructions for safety with gait and other mobility. Safely completed stair training with patient and wife and daughter who were present and actively participated in therapy. Pt has decided to return home with 24 hour care from family. Feel he has progressed enough to return home safely, as he was able to safely ambulate at a NWB status on LLE today. Ready for Schneider/c from PT standpoint. Patient will continue to greatly  benefit from skilled physical therapy services at home with HHPT to further improve independence with functional mobility.   Follow Up Recommendations  Home health PT;Supervision for mobility/OOB     Equipment Recommendations  3in1 (PT);Rolling walker with 5" wheels    Recommendations for Other Services       Precautions / Restrictions Precautions Precautions: Fall Restrictions Weight Bearing Restrictions: Yes LLE Weight Bearing: Non weight bearing    Mobility  Bed Mobility Overal bed mobility: Needs Assistance Bed Mobility: Supine to Sit     Supine to sit: Min guard     General bed mobility comments: Min guard for safety with VC for technique. HOB flat and no use of rail. Educated on use of RLE to support LLE out of bed. Requires extra time.  Transfers Overall transfer level: Needs assistance Equipment used: Rolling walker (2 wheeled) Transfers: Sit to/from Stand Sit to Stand: Min guard         General transfer comment: Min guard for safety with VC to maintain NWB with LLE on one occasion. Pt verbalizes understanding. Performed from lowest bed setting and  recliner.  Ambulation/Gait Ambulation/Gait assistance: Min guard Ambulation Distance (Feet): 80 Feet Assistive device: Rolling walker (2 wheeled) Gait Pattern/deviations:  ("hop-to" gait pattern)   Gait velocity interpretation: Below normal speed for age/gender General Gait Details: Cues for smooth step with RLE. Improved foot clearance today. VC for walker placement within base of support. No loss of balance noted during therapy session.   Stairs Stairs: Yes Stairs assistance: +2 safety/equipment Stair Management: No rails;Step to pattern;Backwards;With walker Number of Stairs: 1 (x2) General stair comments: Demonstrated to patient prior to having him practice. Wife and daugther present and actively participated in therapy session. Wife blocked walker during backwards step-up similar to home environment. Able to safely complete.  Wheelchair Mobility    Modified Rankin (Stroke Patients Only)       Balance                                    Cognition Arousal/Alertness: Awake/alert Behavior During Therapy: WFL for tasks assessed/performed Overall Cognitive Status: Within Functional Limits for tasks assessed                      Exercises General Exercises - Lower Extremity Ankle Circles/Pumps: AROM;Both;10 reps;Seated Quad Sets: 10 reps;Seated;Both;Strengthening Long Arc Quad: Left;10 reps;Seated;Strengthening Heel Slides: AROM;Left;10 reps;Supine Hip ABduction/ADduction: AAROM;Left;10 reps;Supine    General Comments        Pertinent Vitals/Pain Pain Assessment: 0-10 Pain Score: 2  Pain Location: Pelvis Pain Descriptors / Indicators: Constant Pain Intervention(s): Limited activity within patient's tolerance;Monitored  during session;Repositioned    Home Living                      Prior Function            PT Goals (current goals can now be found in the care plan section) Acute Rehab PT Goals PT Goal Formulation: With  patient Time For Goal Achievement: 02/20/14 Potential to Achieve Goals: Good Progress towards PT goals: Progressing toward goals    Frequency  Min 5X/week    PT Plan Discharge plan needs to be updated    Co-evaluation             End of Session   Activity Tolerance: Patient tolerated treatment well Patient left: in chair;with call bell/phone within reach;with family/visitor present     Time: 1610-9604 PT Time Calculation (min): 27 min  Charges:  $Gait Training: 8-22 mins $Therapeutic Exercise: 8-22 mins                    G Codes:      [employer], [place] [phone]  Berton Mount 02/16/2014, 12:57 PM

## 2014-02-16 NOTE — Progress Notes (Signed)
I met with Edwin Schneider, his daughter, and his wife at bedside. Daughter translated as needed. I discussed the possibility of an inpt rehab admission for about 7 days. I also discussed his cost of care with his [employer] and that payment plans can assist in decreasing cost of care here at [facility]. Edwin Schneider and family feel Edwin Schneider at a level today functionally to be able to go home. They report he is maintaining his NWB status to LE better today and they have seen him mobilize with staff in room today. Daughter reports much improvement over yesterday and they feel they can manage well with HH. We will sign off. I have updated RN CM and SW of this plan. [phone]

## 2014-02-16 NOTE — Social Work (Signed)
SW spoke with pt in regards to SNF vs CIR vs home. Pt/family has concerns about [facility] as well as at any rehab facility. Pt informed SW that they have called the insurance company to inquire about benefits. Pt/family has voiced concerns about cost on and that they can't afford the cost of care. Pt/family reported that they would like to return home and have in home health care. SW shared  information with RN,CM and PA, Earney HamburgMichael Schneider and PT. Pt awaiting disposition.   Edwin Schneider,MSW [phone]

## 2014-02-17 ENCOUNTER — Encounter (INDEPENDENT_AMBULATORY_CARE_PROVIDER_SITE_OTHER): Payer: Self-pay | Admitting: Orthopedic Surgery

## 2014-02-17 MED ORDER — TRAMADOL HCL 50 MG PO TABS: 50.0000 mg | ORAL_TABLET | Freq: Four times a day (QID) | ORAL | Status: DC | PRN

## 2014-02-17 MED ORDER — ACETAMINOPHEN 500 MG PO TABS: 1000.0000 mg | ORAL_TABLET | Freq: Four times a day (QID) | ORAL | Status: DC | PRN

## 2014-02-17 MED ORDER — ENOXAPARIN SODIUM 30 MG/0.3ML ~~LOC~~ SOLN: 30.0000 mg | Freq: Two times a day (BID) | SUBCUTANEOUS | Status: DC

## 2014-02-17 NOTE — Discharge Summary (Signed)
Physician Discharge Summary  Patient ID: Edwin Schneider MRN: [id number] DOB/AGE: 1955-10-27 58 y.o.  Admit date: 02/11/2014 Discharge date: 02/17/2014  Discharge Diagnoses Patient Active Problem List   Diagnosis Date Noted  . Fall 02/16/2014  . Concussion 02/16/2014  . Ileus, postoperative 02/16/2014  . Multiple pelvic fractures 02/11/2014  . HYPERLIPIDEMIA 02/27/2010  . LEG PAIN, BILATERAL 02/27/2010  . ANXIETY STATE, UNSPECIFIED 01/26/2010  . ABDOMINAL PAIN, EPIGASTRIC 05/13/2009  . GERD 02/28/2009  . ALLERGIC RHINITIS 09/27/2008  . DIZZINESS 09/27/2008  . Lateral Epicondylitis  of Elbow 06/09/2008  . UTI 09/10/2007  . ABDOMINAL PAIN, LEFT UPPER QUADRANT 09/10/2007  . HYPOTHYROIDISM 04/03/2007  . GOUT NOS 01/31/2007    Consultants Drs. Glee ArvinMichael Xu and Myrene GalasMichael Handy for orthopedic surgery  Dr. Ihor GullyMark Ottelin for urology  Dr. Claudette LawsAndrew Kirsteins for PM&R   Procedures 8/6 -- Placement of femoral traction pin by Dr. Roda ShuttersXu  8/6 -- Cystoscopy by Dr. Vernie Ammonsttelin  8/7 -- Open reduction and internal fixation of anterior pelvic ring, pubic symphysis, bilateral sacroiliac screw fixation, percutaneous, and removal of foreign body, right foot by Dr. Carola FrostHandy   HPI: Edwin Schneider was on his roof and was about to climb down when the extension piece of his extension ladder gave way and he fell, striking his buttocks and back. He admitted to a short loss of consciousness but denies amnesia to the event. He was unable to move afterwards secondary to pain. [facility] and was not a trauma activation. His workup in the ED included CT scans of his head, cervical spine, chest, abdomen, and pelvis as well as an attempted retrograde urethrogram. Orthopedic surgery was consulted for the severe pelvic fractures. Urology was consulted as radiology was unable to pass contrast through the urethra. Orthopedic surgery placed a traction pin to help stabilize the pelvis. Urology performed a cystogram that was  normal. He was admitted to the trauma service.   Hospital Course: Care of the patient's pelvic fractures was turned over to the orthopedic trauma specialist and he took the patient to the OR the following day for definitive fixation. Following this he was mobilized with physical and occupational therapies who recommended inpatient rehabilitation. They were consulted and agreed. He had a mild postoperative partial ileus that resolved with time. In the end the patient decided that inpatient rehabilitation was going to be too expensive and elected to go home. He was discharged there in good condition.      Medication List         acetaminophen 500 MG tablet  Commonly known as:  TYLENOL  Take 2 tablets (1,000 mg total) by mouth every 6 (six) hours as needed (Pain).     allopurinol 300 MG tablet  Commonly known as:  ZYLOPRIM  Take 300 mg by mouth daily.     enoxaparin 30 MG/0.3ML injection  Commonly known as:  LOVENOX  Inject 0.3 mLs (30 mg total) into the skin every 12 (twelve) hours.     levothyroxine 88 MCG tablet  Commonly known as:  SYNTHROID, LEVOTHROID  Take 88 mcg by mouth daily before breakfast.     traMADol 50 MG tablet  Commonly known as:  ULTRAM  Take 1-2 tablets (50-100 mg total) by mouth every 6 (six) hours as needed (More severe pain).             Follow-up Information   Follow up with HANDY,Ashdon Gillson H, MD. Schedule an appointment as soon as possible for a visit in 14 days. (  For wound re-check, For suture removal)    Specialty:  Orthopedic Surgery   Contact information:   [address] [place] [address] [phone]       Follow up with [facility]. (Someone from [facility] will contact you concerning start date and time for physical therapy.)    Contact information:   [address] [place] [address] [phone]       Call [facility]. (As needed)    Contact information:   [address] [place] [address] [phone]       Signed: Freeman Caldron, PA-C Pager: [phone] General Trauma PA Pager: [phone] 02/17/2014, 12:39 PM

## 2014-02-17 NOTE — Progress Notes (Signed)
Patient ID: Edwin Schneider, male   DOB: 1955-12-21, 58 y.o.   MRN: [id number]   LOS: 6 days   Subjective: Having more pelvic pain this morning but bloating is better.   Objective: Vital signs in last 24 hours: Temp:  [97.9 F (36.6 C)-98.8 F (37.1 C)] 98.2 F (36.8 C) (08/12 0546) Pulse Rate:  [83-95] 83 (08/12 0546) Resp:  [16-18] 16 (08/12 0546) BP: (137-154)/(91) 140/91 mmHg (08/12 0546) SpO2:  [97 %-100 %] 97 % (08/12 0546) Last BM Date: 02/16/14   Physical Exam General appearance: alert and no distress Resp: clear to auscultation bilaterally Cardio: regular rate and rhythm GI: normal findings: bowel sounds normal and soft, non-tender   Assessment/Plan: Fall  Concussion  Multiple pelvic fxs s/p SI screws -- Mobilization per Dr. Carola FrostHandy, NWB LLE  ABL anemia -- stable  Multiple medical problems -- Home meds  FEN -- Ileus appears resolved VTE -- SCD's, Lovenox  Dispo -- Will make sure pt can tolerate breakfast then d/c home later today.    Freeman CaldronMichael J. Justyn Langham, PA-C Pager: [phone] General Trauma PA Pager: [phone]  02/17/2014

## 2014-02-17 NOTE — Progress Notes (Signed)
Patient looks great.  Abdomen is soft.  Will come back to speak with his daughter when she comes by later.  This patient has been seen and I agree with the findings and treatment plan.  Marta LamasJames O. Gae BonWyatt, III, MD, FACS [phone] (pager) [phone] (direct pager) Trauma Surgeon

## 2014-02-17 NOTE — Progress Notes (Signed)
Patient provided with discharge instructions and follow up information. [facility] has [facility] for HHPT. Family and patient educated on Lovenox injection daily and teach back returned. He is going home at this time with family support.

## 2014-02-17 NOTE — Progress Notes (Signed)
Physical Therapy Treatment Patient Details Name: Edwin Schneider D Tsuchiya MRN: [id number] DOB: 1955/11/15 Today's Date: 02/17/2014    History of Present Illness 58 y.o. male s/p OPEN REDUCTION INTERNAL FIXATION (ORIF) PELVIC RING  Hx of gout and thyroid disease.    PT Comments    Continues to progress well towards acute physical therapy goals. Pt no longer reports episodes dizziness or confusion. States there is less pain while ambulating and safely maintains NWB on LLE during ambulation with bouts up to 125 feet. Feel he is ready for d/c from PT standpoint when medically ready. Will greatly benefit from continued skilled PT at home with HHPT.  Follow Up Recommendations  Home health PT;Supervision for mobility/OOB     Equipment Recommendations  3in1 (PT);Rolling walker with 5" wheels    Recommendations for Other Services       Precautions / Restrictions Precautions Precautions: Fall Restrictions Weight Bearing Restrictions: Yes LLE Weight Bearing: Non weight bearing    Mobility  Bed Mobility                  Transfers Overall transfer level: Needs assistance Equipment used: Rolling walker (2 wheeled) Transfers: Sit to/from Stand Sit to Stand: Min guard         General transfer comment: Min guard for safety with VC to maintain NWB with LLE on one occasion. Pt verbalizes understanding. Performed from recliner and low bench. VC for hand placement while on low bench.  Ambulation/Gait Ambulation/Gait assistance: Min guard Ambulation Distance (Feet): 125 Feet Assistive device: Rolling walker (2 wheeled) Gait Pattern/deviations:  ("hop-to" gait pattern)   Gait velocity interpretation: Below normal speed for age/gender General Gait Details: Focused on upright posture and forward gaze. Occasional verbal cues for walker placement. Improving smooth transition of step with RLE. Maintains NWB on LLE. Was unable to walk further and required a chair to return to  room   Stairs            Wheelchair Mobility    Modified Rankin (Stroke Patients Only)       Balance                                    Cognition Arousal/Alertness: Awake/alert Behavior During Therapy: WFL for tasks assessed/performed Overall Cognitive Status: Within Functional Limits for tasks assessed                      Exercises General Exercises - Lower Extremity Ankle Circles/Pumps: AROM;Both;10 reps;Seated Quad Sets: 10 reps;Seated;Both;Strengthening Long Arc Quad: Left;10 reps;Seated;Strengthening Heel Slides: AROM;Left;10 reps;Supine Hip ABduction/ADduction: AAROM;Left;10 reps;Supine    General Comments General comments (skin integrity, edema, etc.): States he has less pain when stepping on RLE compared to previous therapy sessions. reports he no longer has confusion and does not become dizzy      Pertinent Vitals/Pain Pain Assessment: No/denies pain Pain Score: 0-No pain (at rest) Pain Intervention(s): Limited activity within patient's tolerance;Monitored during session;Repositioned    Home Living                      Prior Function            PT Goals (current goals can now be found in the care plan section) Acute Rehab PT Goals PT Goal Formulation: With patient Time For Goal Achievement: 02/20/14 Potential to Achieve Goals: Good Progress towards PT goals: Progressing toward goals  Frequency  Min 5X/week    PT Plan Discharge plan needs to be updated;Current plan remains appropriate    Co-evaluation             End of Session   Activity Tolerance: Patient tolerated treatment well Patient left: in chair;with call bell/phone within reach;with family/visitor present     Time: 1610-9604 PT Time Calculation (min): 18 min  Charges:  $Gait Training: 8-22 mins                    G Codes:      [employer], [place] [phone]  Berton Mount 02/17/2014, 1:07 PM

## 2014-02-22 NOTE — Anesthesia Postprocedure Evaluation (Signed)
  Anesthesia Post-op Note  Patient: Edwin Schneider  Procedure(s) Performed: Procedure(s): OPEN REDUCTION INTERNAL FIXATION (ORIF) PELVIC RING (N/A)  Patient Location: PACU  Anesthesia Type:General  Level of Consciousness: awake, alert , oriented and patient cooperative  Airway and Oxygen Therapy: Patient Spontanous Breathing  Post-op Pain: moderate  Post-op Assessment: Post-op Vital signs reviewed, Patient's Cardiovascular Status Stable, Respiratory Function Stable, Patent Airway, No signs of Nausea or vomiting and Pain level controlled  Post-op Vital Signs: stable  Last Vitals:  Filed Vitals:   02/17/14 1259  BP: 140/89  Pulse: 80  Temp: 36.8 C  Resp:     Complications: No apparent anesthesia complications

## 2014-08-20 ENCOUNTER — Other Ambulatory Visit: Payer: Self-pay | Admitting: Orthopedic Surgery

## 2014-08-20 DIAGNOSIS — S32811K Multiple fractures of pelvis with unstable disruption of pelvic ring, subsequent encounter for fracture with nonunion: Secondary | ICD-10-CM

## 2014-08-24 ENCOUNTER — Ambulatory Visit
Admission: RE | Admit: 2014-08-24 | Discharge: 2014-08-24 | Disposition: A | Discharge status: Home / Self Care | Payer: BLUE CROSS/BLUE SHIELD | Source: Ambulatory Visit | Attending: Orthopedic Surgery | Admitting: Orthopedic Surgery

## 2014-08-24 DIAGNOSIS — S32811K Multiple fractures of pelvis with unstable disruption of pelvic ring, subsequent encounter for fracture with nonunion: Secondary | ICD-10-CM

## 2014-08-25 ENCOUNTER — Other Ambulatory Visit: Payer: Self-pay

## 2014-09-08 ENCOUNTER — Other Ambulatory Visit: Payer: Self-pay | Admitting: Orthopedic Surgery

## 2014-09-08 DIAGNOSIS — IMO0002 Reserved for concepts with insufficient information to code with codable children: Secondary | ICD-10-CM

## 2014-09-08 DIAGNOSIS — M1732 Unilateral post-traumatic osteoarthritis, left knee: Secondary | ICD-10-CM

## 2014-09-08 DIAGNOSIS — M25372 Other instability, left ankle: Secondary | ICD-10-CM

## 2014-10-01 ENCOUNTER — Other Ambulatory Visit: Payer: BLUE CROSS/BLUE SHIELD

## 2014-10-08 ENCOUNTER — Other Ambulatory Visit: Payer: BLUE CROSS/BLUE SHIELD

## 2014-10-19 ENCOUNTER — Ambulatory Visit
Admission: RE | Admit: 2014-10-19 | Discharge: 2014-10-19 | Disposition: A | Discharge status: Home / Self Care | Payer: BLUE CROSS/BLUE SHIELD | Source: Ambulatory Visit | Attending: Orthopedic Surgery | Admitting: Orthopedic Surgery

## 2014-10-19 DIAGNOSIS — M1732 Unilateral post-traumatic osteoarthritis, left knee: Secondary | ICD-10-CM

## 2014-10-19 DIAGNOSIS — M25372 Other instability, left ankle: Secondary | ICD-10-CM

## 2014-10-19 DIAGNOSIS — IMO0002 Reserved for concepts with insufficient information to code with codable children: Secondary | ICD-10-CM

## 2014-10-19 MED ORDER — IOHEXOL 180 MG/ML  SOLN
1.0000 mL | Freq: Once | INTRAMUSCULAR | Status: AC | PRN
  Administered 2014-10-19: 1 mL via INTRA_ARTICULAR

## 2014-10-19 MED ORDER — METHYLPREDNISOLONE ACETATE 40 MG/ML INJ SUSP (RADIOLOG
120.0000 mg | Freq: Once | INTRAMUSCULAR | Status: AC
  Administered 2014-10-19: 120 mg via INTRA_ARTICULAR

## 2014-10-19 NOTE — Discharge Instructions (Signed)

## 2016-10-04 ENCOUNTER — Encounter: Payer: Self-pay | Admitting: Gastroenterology

## 2017-02-06 IMAGING — CT CT PELVIS W/O CM
2 of 4 series · 13 of 32 positions shown, 19 images · non-contrast
Comparison: None.

CLINICAL DATA: Follow-up multiple pelvic fractures. Status post
fall from a roof 02/11/2014.

EXAM:
CT PELVIS WITHOUT CONTRAST
TECHNIQUE: Multidetector CT imaging of the pelvis was performed following the
standard protocol without intravenous contrast.

[Series 103: cor soft · coronal · 0.70mm/px · 4 of 159 slices shown]
[im 18/159  soft-tissue]
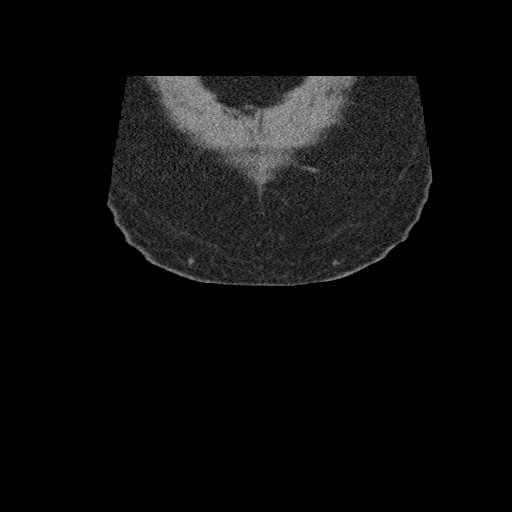
[im 36/159  soft-tissue]
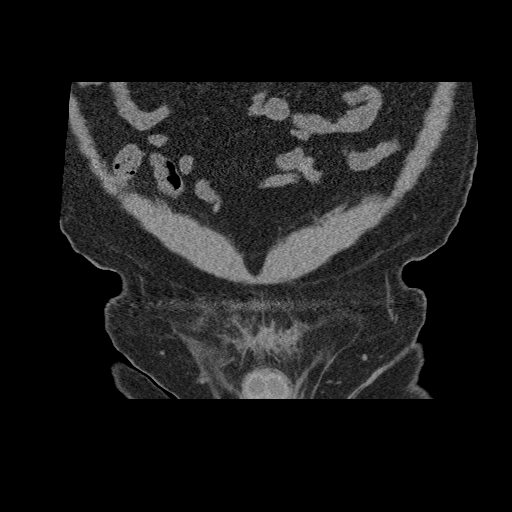
[im 53/159  soft-tissue]
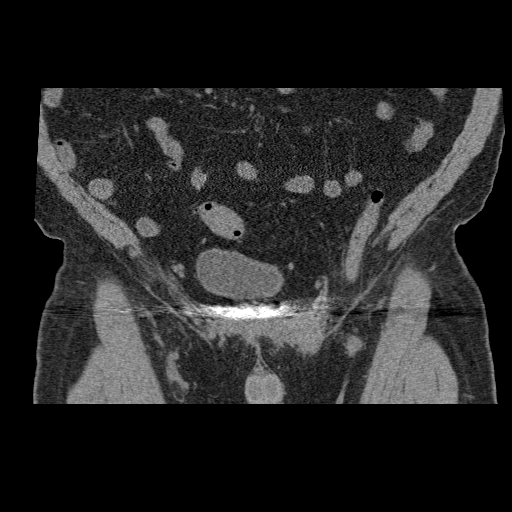
[im 71/159  soft-tissue]
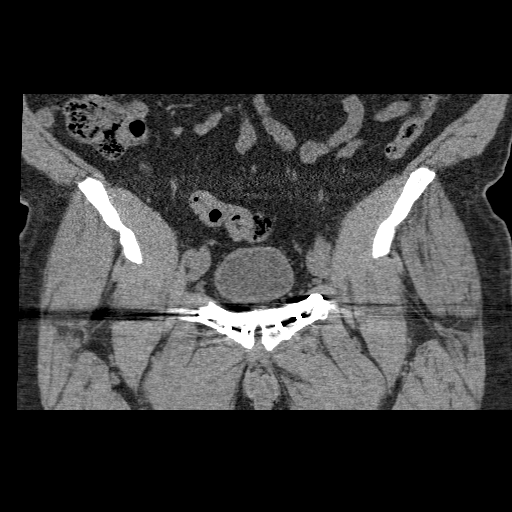

[Series 104: sag soft · sagittal · 0.70mm/px · 9 of 177 slices shown, 15 images]
[im 18/177  soft-tissue]
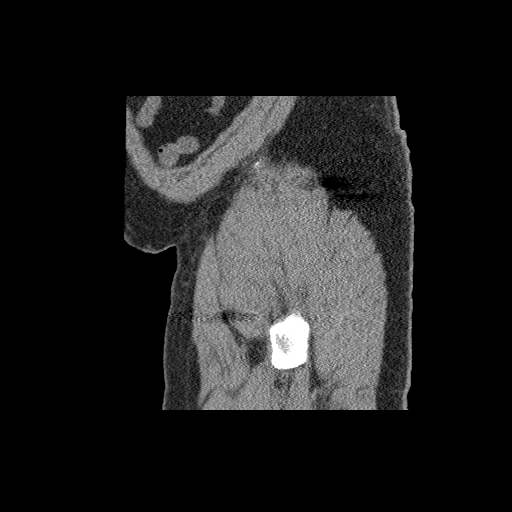
[im 18/177  bone]
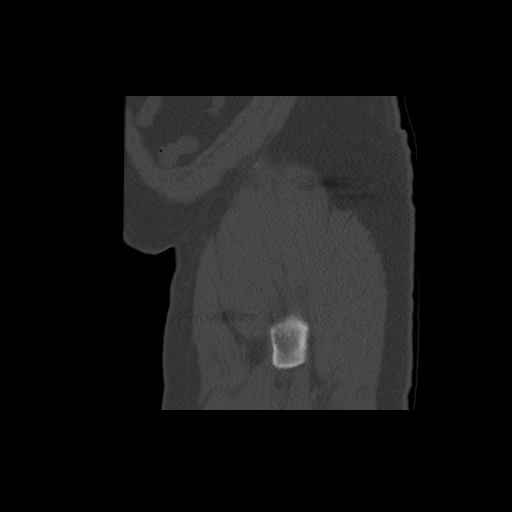
[im 36/177  soft-tissue]
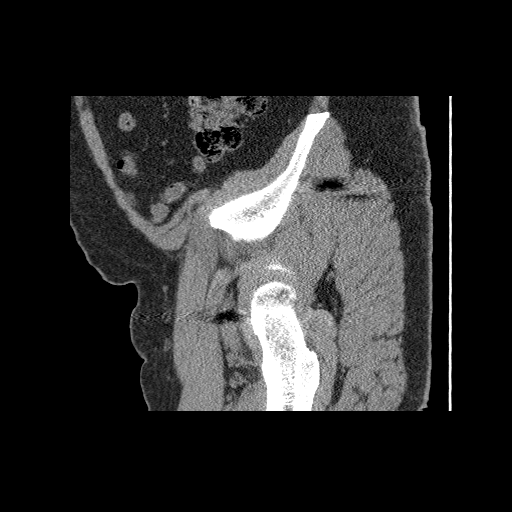
[im 53/177  soft-tissue]
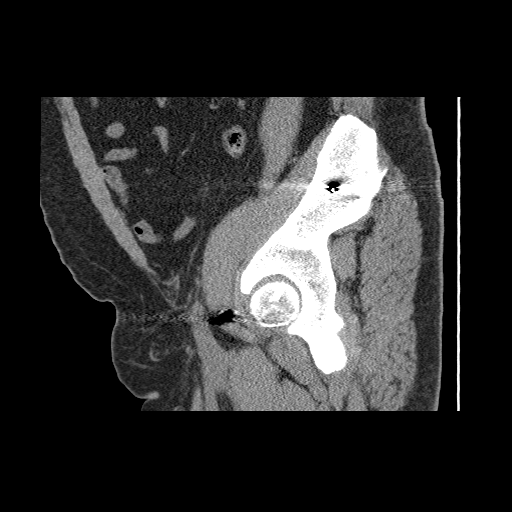
[im 71/177  soft-tissue]
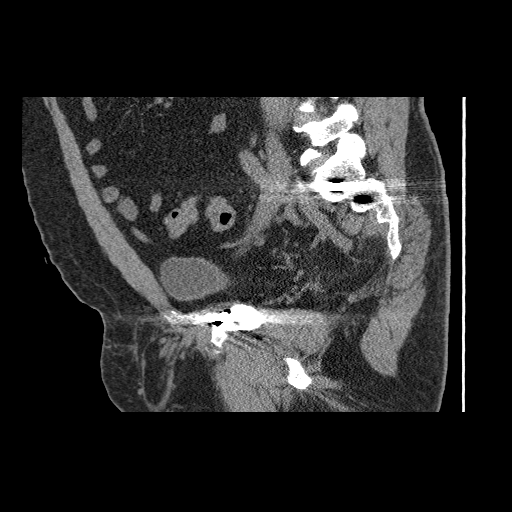
[im 89/177  soft-tissue]
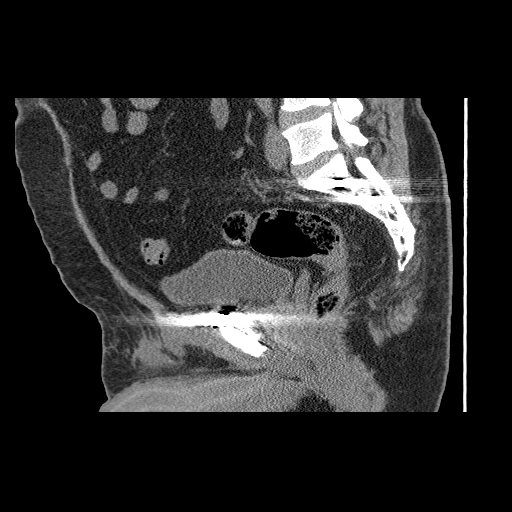
[im 106/177  soft-tissue]
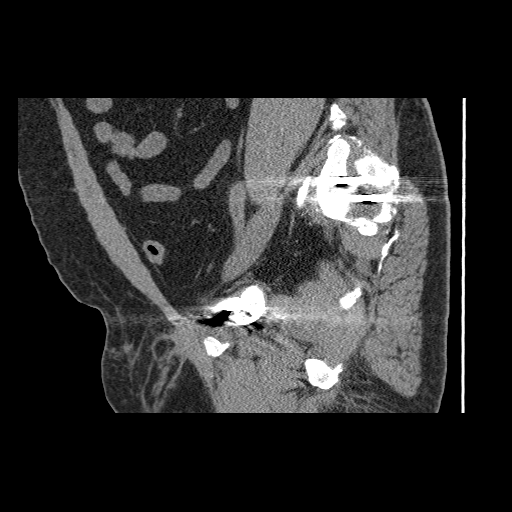
[im 106/177  lung]
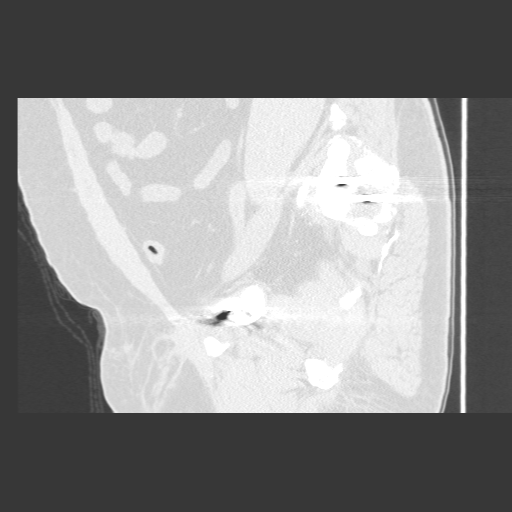
[im 124/177  soft-tissue]
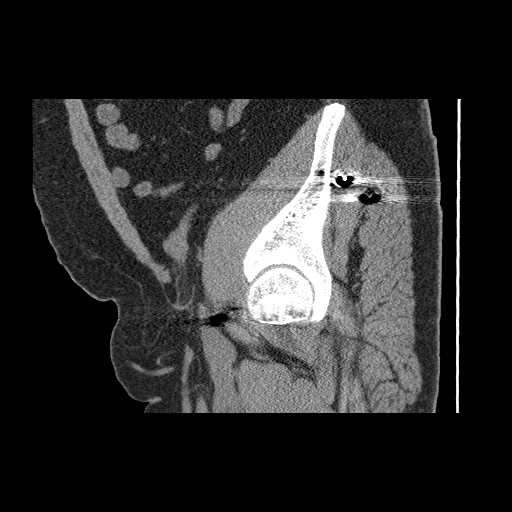
[im 124/177  lung]
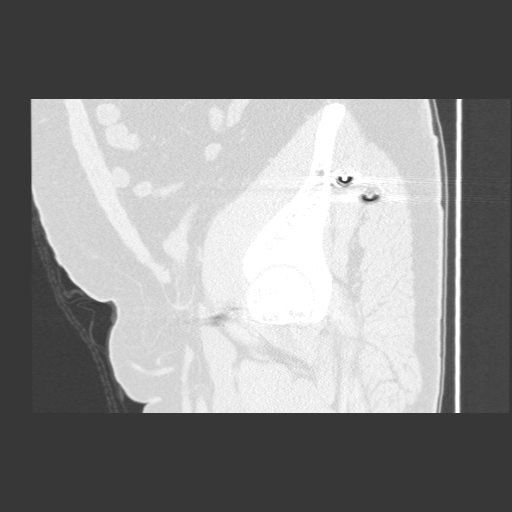
[im 141/177  soft-tissue]
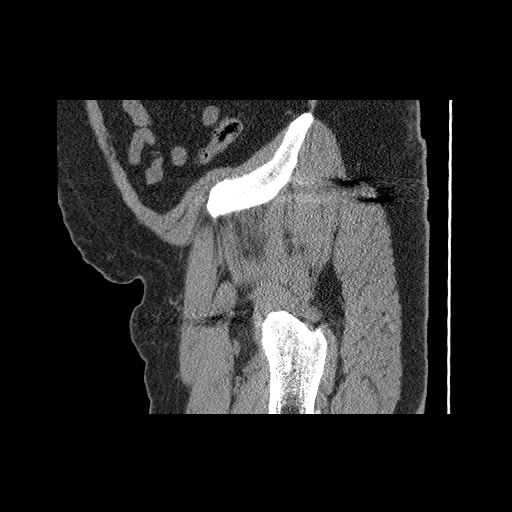
[im 141/177  lung]
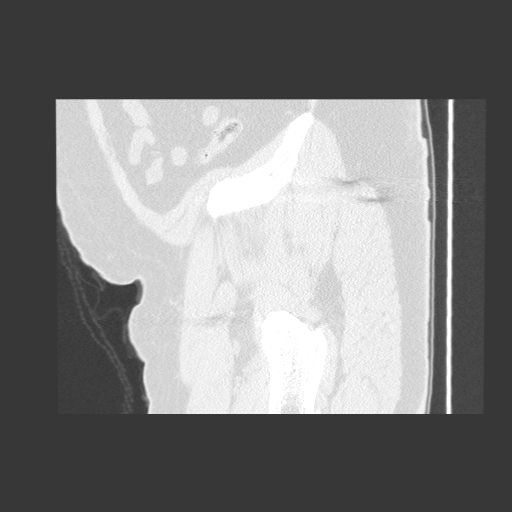
[im 159/177  soft-tissue]
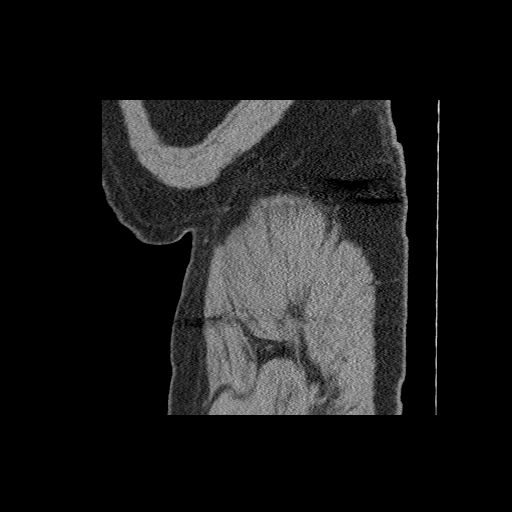
[im 159/177  lung]
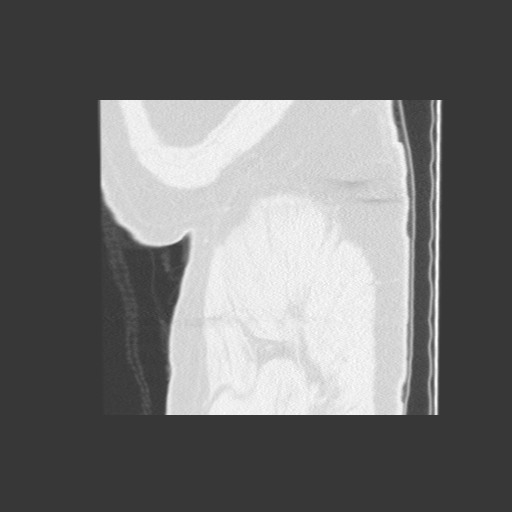
[im 159/177  bone]
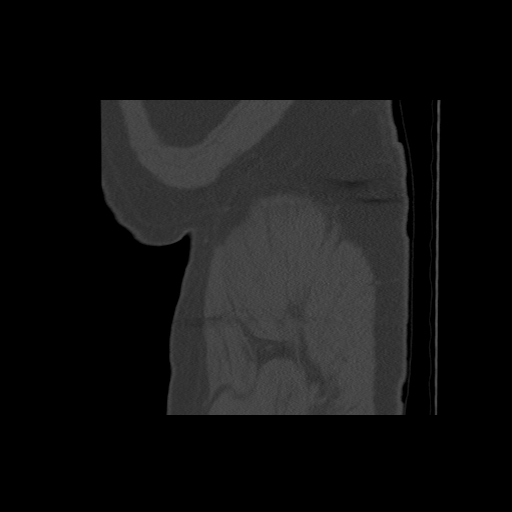

[13 of 32 positions shown; findings below may reference images not displayed]

FINDINGS: There is a cannulated lag screw coursing from left to right
transfixing bilateral SI joints. There is a shorter cannulated lag
screw coursing from left-to-right transfixing the posterior left SI
joint. There is mild persistent left SI joint widening. There are
multiple corticated fracture fragment both anterior and posterior to
the left SI joint. There is no hardware failure or complication.

There is a malleable side plate with multiple interlocking screws
transfixing the pubic symphysis without failure or complication.
There are multiple ununited well corticated ossific fragments
anterior to the right and left pubic body likely reflecting
heterotopic ossification. There is a large area of soft tissue
density anterior to the pubic symphysis with adjacent fat stranding
likely reflecting postsurgical changes.

There is no acute fracture or dislocation. Bilateral hips are
normal. There is no lytic or sclerotic osseous lesion.

There is no fluid collection or hematoma.
IMPRESSION: 1. Internal fixation of left sacroiliac joint diastases without
hardware failure or complication. There is mild persistent widening
of the left SI joint.
2. Internal fixation of pubic symphysis diastases which is in
anatomic alignment without failure or complication. There is
adjacent heterotopic ossification anteriorly.
3. There is a large area of soft tissue density anterior to the
pubic symphysis with adjacent fat stranding likely reflecting
postsurgical changes.

## 2020-04-11 ENCOUNTER — Other Ambulatory Visit (HOSPITAL_COMMUNITY): Payer: Self-pay

## 2020-04-11 ENCOUNTER — Telehealth (HOSPITAL_COMMUNITY): Payer: Self-pay

## 2020-04-11 NOTE — Telephone Encounter (Signed)
Patient called and prescreen for MAB treatment. Pt states sx started on 04/04/20. Patient tested +04/11/20. Pt has Hx of HTN. Pt intrested on tx. Pt informed APP will call them with further information.

## 2020-04-12 ENCOUNTER — Ambulatory Visit (HOSPITAL_COMMUNITY)
Admission: RE | Admit: 2020-04-12 | Discharge: 2020-04-12 | Disposition: A | Discharge status: Home / Self Care | Payer: Medicare Other | Source: Ambulatory Visit | Attending: Pulmonary Disease | Admitting: Pulmonary Disease

## 2020-04-12 ENCOUNTER — Other Ambulatory Visit: Payer: Self-pay | Admitting: Infectious Diseases

## 2020-04-12 DIAGNOSIS — E663 Overweight: Secondary | ICD-10-CM | POA: Insufficient documentation

## 2020-04-12 DIAGNOSIS — Z23 Encounter for immunization: Secondary | ICD-10-CM | POA: Insufficient documentation

## 2020-04-12 DIAGNOSIS — U071 COVID-19: Secondary | ICD-10-CM

## 2020-04-12 MED ORDER — METHYLPREDNISOLONE SODIUM SUCC 125 MG IJ SOLR
125.0000 mg | Freq: Once | INTRAMUSCULAR | Status: AC | PRN
  Administered 2020-04-12: 125 mg via INTRAVENOUS
  Filled 2020-04-12: qty 2

## 2020-04-12 MED ORDER — DIPHENHYDRAMINE HCL 50 MG/ML IJ SOLN: 50.0000 mg | Freq: Once | INTRAMUSCULAR | Status: DC | PRN

## 2020-04-12 MED ORDER — SODIUM CHLORIDE 0.9 % IV SOLN: INTRAVENOUS | Status: DC | PRN

## 2020-04-12 MED ORDER — ALBUTEROL SULFATE HFA 108 (90 BASE) MCG/ACT IN AERS: 2.0000 | INHALATION_SPRAY | Freq: Once | RESPIRATORY_TRACT | Status: DC | PRN

## 2020-04-12 MED ORDER — EPINEPHRINE 0.3 MG/0.3ML IJ SOAJ: 0.3000 mg | Freq: Once | INTRAMUSCULAR | Status: DC | PRN

## 2020-04-12 MED ORDER — SODIUM CHLORIDE 0.9 % IV SOLN
1200.0000 mg | Freq: Once | INTRAVENOUS | Status: AC
  Administered 2020-04-12: 1200 mg via INTRAVENOUS

## 2020-04-12 MED ORDER — FAMOTIDINE IN NACL 20-0.9 MG/50ML-% IV SOLN
20.0000 mg | Freq: Once | INTRAVENOUS | Status: AC | PRN
  Administered 2020-04-12: 20 mg via INTRAVENOUS
  Filled 2020-04-12: qty 50

## 2020-04-12 NOTE — Telephone Encounter (Signed)
Called to Discuss with patient about Covid symptoms and the use of the monoclonal antibody infusion for those with mild to moderate Covid symptoms and at a high risk of hospitalization.     Pt appears to qualify for this infusion due to co-morbid conditions and/or a member of an at-risk group in accordance with the FDA Emergency Use Authorization.   I was unable to reach Edwin Schneider - I spoke with Edwin Schneider over the phone to verify transportation, describe the treatment benefit/risks and answer all questions.  Transportation has been set up for them with pick up time 1:15 pm  [employer] used for the visit.    Rexene Alberts, MSN, NP-C [facility]  [email or web address] Pager: [phone] Office: [phone] [facility] Main Line: [phone]

## 2020-04-12 NOTE — Progress Notes (Signed)
I connected by phone with Edwin Schneider on 04/12/2020 at 10:49 AM to discuss the potential use of a new treatment for mild to moderate COVID-19 viral infection in non-hospitalized patients.  This patient is a 64 y.o. male that meets the FDA criteria for Emergency Use Authorization of COVID monoclonal antibody casirivimab/imdevimab or bamlanivimab/eteseviamb.  Has a (+) direct SARS-CoV-2 viral test result  Has mild or moderate COVID-19   Is NOT hospitalized due to COVID-19  Is within 10 days of symptom onset  Has at least one of the high risk factor(s) for progression to severe COVID-19 and/or hospitalization as defined in EUA.  Specific high risk criteria : BMI > 25, Cardiovascular disease or hypertension and Other high risk medical condition per CDC:  SVI criteria met, hx blood clots    I have spoken and communicated the following to the patient or parent/caregiver regarding COVID monoclonal antibody treatment:  1. FDA has authorized the emergency use for the treatment of mild to moderate COVID-19 in adults and pediatric patients with positive results of direct SARS-CoV-2 viral testing who are 17 years of age and older weighing at least 40 kg, and who are at high risk for progressing to severe COVID-19 and/or hospitalization.  2. The significant known and potential risks and benefits of COVID monoclonal antibody, and the extent to which such potential risks and benefits are unknown.  3. Information on available alternative treatments and the risks and benefits of those alternatives, including clinical trials.  4. Patients treated with COVID monoclonal antibody should continue to self-isolate and use infection control measures (e.g., wear mask, isolate, social distance, avoid sharing personal items, clean and disinfect "high touch" surfaces, and frequent handwashing) according to CDC guidelines.   5. The patient or parent/caregiver has the option to accept or refuse COVID monoclonal  antibody treatment.  After reviewing this information with the patient, the patient has agreed to receive one of the available covid 19 monoclonal antibodies and will be provided an appropriate fact sheet prior to infusion. Janene Madeira, NP 04/12/2020 10:49 AM

## 2020-04-12 NOTE — Progress Notes (Signed)
  Diagnosis: COVID-19  Physician: Dr. Delford Field  Procedure: Covid Infusion Clinic Med: casirivimab\imdevimab infusion - Provided patient with casirivimab\imdevimab fact sheet for patients, parents and caregivers prior to infusion.  Complications: Patient developed intense shivering and chills 20 mintues post infusion. Provider Di Kindle, NP on site. Advised RN to [employer] and Pepcid per reaction protocol, effective. Client also receivied 250cc of NS via IV. Post VSS. Client verbalzied he was feeling "much better". RN provided post infusion education in addition to when to call 911, patient verbaliized undertstanding.   Discharge: Discharged home   [facility] 04/12/2020

## 2020-04-12 NOTE — Discharge Instructions (Signed)
COVID-19 COVID-19 El COVID-19 es una infeccin respiratoria causada por un virus llamado coronavirus tipo 2 causante del sndrome respiratorio agudo grave (SARS-CoV-2). La enfermedad tambin se conoce como enfermedad por coronavirus o nuevo coronavirus. En algunas personas, el virus puede no ocasionar sntomas. En otras, puede producir una infeccin grave. La infeccin puede empeorar rpidamente y causar complicaciones, como:  Neumona o infeccin en los pulmones.  Sndrome de dificultad respiratoria aguda o SDRA. Es una afeccin que se caracteriza por la acumulacin de lquido en los pulmones, que impide que los pulmones se llenen de aire y pasen oxgeno a la sangre.  Insuficiencia respiratoria aguda. Es una afeccin que se caracteriza porque no pasa suficiente oxgeno de los pulmones al cuerpo o porque el dixido de carbono no pasa de los pulmones hacia afuera del cuerpo.  Sepsis o choque sptico. Se trata de una reaccin grave del cuerpo ante una infeccin.  Problemas de coagulacin.  Infecciones secundarias debido a bacterias u hongos.  Falla de rganos. Ocurre cuando los rganos del cuerpo dejan de funcionar. El virus que causa el COVID-19 es contagioso. Esto significa que puede transmitirse de una persona a otra a travs de las gotitas de saliva de la tos y de los estornudos (secreciones respiratorias). Cules son las causas? Esta enfermedad es causada por un virus. Usted puede contagiarse con este virus:  Al inspirar las gotitas de una persona infectada. Las gotitas pueden diseminarse cuando una persona respira, habla, canta, tose o estornuda.  Al tocar algo, como una mesa o el picaportes de una puerta, que estuvo expuesto al virus (contaminado) y luego tocarse la boca, nariz o los ojos. Qu incrementa el riesgo? Riesgo de infeccin Es ms probable que se infecte con este virus si:  Se encuentra a una distancia menor a 6 pies (2 metros) de una persona con COVID-19.  Cuida o  vive con una persona infectada con COVID-19.  Pasa tiempo en espacios interiores repletos de gente o vive en viviendas compartidas. Riesgo de enfermedad grave Es ms probable que se enferme gravemente por el virus si:  Tiene 50aos o ms. Cuanto mayor sea su edad, mayor ser el riesgo de tener una enfermedad grave.  Vive en un hogar de ancianos o centro de atencin a largo plazo.  Tiene cncer.  Tiene una enfermedad prolongada (crnica), como las siguientes: ? Enfermedad pulmonar crnica, que incluye la enfermedad pulmonar obstructiva crnica o asma. ? Una enfermedad crnica que disminuye la capacidad del cuerpo para combatir las infecciones (immunocomprometido). ? Enfermedad cardaca, que incluye insuficiencia cardaca, una afeccin que se caracteriza porque las arterias que llegan al corazn se estrechan u obstruyen (arteriopata coronaria) o una enfermedad que provoca que el msculo cardaco se engrose, se debilite o endurezca (miocardiopata). ? Diabetes. ? Enfermedad renal crnica. ? Anemia drepanoctica, una enfermedad que se caracteriza porque los glbulos rojos tienen una forma anormal de "hoz". ? Enfermedad heptica.  Es obeso. Cules son los signos o sntomas? Los sntomas de esta afeccin pueden ser de leves a graves. Los sntomas pueden aparecer en el trmino de 2 a 14 das despus de haber estado expuesto al virus. Incluyen los siguientes:  Fiebre o escalofros.  Tos.  Dificultad para respirar.  Dolores de cabeza, dolores en el cuerpo o dolores musculares.  Secrecin o congestin nasal.  Dolor de garganta.  Nueva prdida del sentido del gusto o del olfato. Algunas personas tambin pueden tener problemas estomacales, como nuseas, vmitos o diarrea. Es posible que otras personas no tengan sntomas de COVID-19.   Cmo se diagnostica? Esta afeccin se puede diagnosticar en funcin de lo siguiente:  Sus signos y sntomas, especialmente si: ? Vive en una zona  donde hay un brote de COVID-19. ? Viaj recientemente a una zona donde el virus es frecuente. ? Cuida o vive con una persona a quien se le diagnostic COVID-19. ? Estuvo expuesto a una persona a la que se le diagnostic COVID-19.  Un examen fsico.  Anlisis de laboratorio que pueden incluir: ? Tomar una muestra de lquido de la parte posterior de la nariz y la garganta (lquido nasofarngeo), la nariz o la garganta, con un hisopo. ? Una muestra de mucosidad de los pulmones (esputo). ? Anlisis de sangre.  Los estudios de diagnstico por imgenes pueden incluir radiografas, exploracin por tomografa computarizada (TC) o ecografa. Cmo se trata? En este momento, no hay ningn medicamento para tratar el COVID-19. Los medicamentos para tratar otras enfermedades se usan a modo de ensayo para comprobar si son eficaces contra el COVID-19. El mdico le informar sobre las maneras de tratar los sntomas. En la mayora de las personas, la infeccin es leve y puede controlarse en el hogar con reposo, lquidos y medicamentos de venta libre. El tratamiento para una infeccin grave suele realizarse en la unidad de cuidados intensivos (UCI) de un hospital. Puede incluir uno o ms de los siguientes. Estos tratamientos se administran hasta que los sntomas mejoran.  Recibir lquidos y medicamentos a travs de una va intravenosa.  Oxgeno complementario. Para administrar oxgeno extra, se utiliza un tubo en la nariz, una mascarilla o una campana de oxgeno.  Colocarlo para que se recueste boca abajo (decbito prono). Esto facilita el ingreso de oxgeno a los pulmones.  Uso continuo de una mquina de presin positiva de las vas areas (CPAP) o de presin positiva de las vas areas de dos niveles (BPAP). Este tratamiento utiliza una presin de aire leve para mantener las vas respiratorias abiertas. Un tubo conectado a un motor administra oxgeno al cuerpo.  Respirador. Este tratamiento mueve el aire  dentro y fuera de los pulmones mediante el uso de un tubo que se coloca en la trquea.  Traqueostoma. En este procedimiento se hace un orificio en el cuello para insertar un tubo de respiracin.  Oxigenacin por membrana extracorprea (OMEC). En este procedimiento, los pulmones tienen la posibilidad de recuperarse al asumir las funciones del corazn y los pulmones. Suministra oxgeno al cuerpo y elimina el dixido de carbono. Siga estas instrucciones en su casa: Estilo de vida  Si est enfermo, qudese en su casa, excepto para obtener atencin mdica. El mdico le indicar cunto tiempo debe quedarse en casa. Llame al mdico antes de buscar atencin mdica.  Haga reposo en su casa como se lo haya indicado el mdico.  No consuma ningn producto que contenga nicotina o tabaco, como cigarrillos, cigarrillos electrnicos y tabaco de mascar. Si necesita ayuda para dejar de fumar, consulte al mdico.  Retome sus actividades normales segn lo indicado por el mdico. Pregntele al mdico qu actividades son seguras para usted. Instrucciones generales  Use los medicamentos de venta libre y los recetados solamente como se lo haya indicado el mdico.  Beba suficiente lquido como para mantener la orina de color amarillo plido.  Concurra a todas las visitas de seguimiento como se lo haya indicado el mdico. Esto es importante. Cmo se evita?  No hay ninguna vacuna que ayude a prevenir la infeccin por COVID-19. Sin embargo, hay medidas que puede tomar para protegerse y proteger a   otras personas de este virus. Para protegerse:   No viaje a zonas donde el COVID-19 sea un riesgo. Las zonas donde se informa la presencia del COVID-19 cambian con frecuencia. Para identificar las zonas de alto riesgo y las restricciones de viaje, consulte el sitio web de viajes de los Centers for Disease Control and Prevention (CDC) (Centros para el Control y la Prevencin de Enfermedades):  wwwnc.cdc.gov/travel/notices  Si vive o debe viajar a una zona donde el COVID-19 es un riesgo, tome precauciones para evitar infecciones. ? Aljese de las personas enfermas. ? Lvese las manos frecuentemente con agua y jabn durante 20segundos. Use desinfectante para manos con alcohol si no dispone de agua y jabn. ? Evite tocarse la boca, la cara, los ojos o la nariz. ? Evite salir de su casa, siga las indicaciones de su estado y de las autoridades sanitarias locales. ? Si debe salir de su casa, use un barbijo de tela o una mascarilla facial. Asegrese de que le cubra la nariz y la boca. ? Evite los espacios interiores repletos de gente. Mantenga una distancia de al menos 6 pies (2 metros) de otras personas. ? Desinfecte los objetos y las superficies que se tocan con frecuencia todos los das. Pueden incluir:  Encimeras y mesas.  Picaportes e interruptores de luz.  Lavabos, fregaderos y grifos.  Aparatos electrnicos tales como telfonos, controles remotos, teclados, computadoras y tabletas. Cmo proteger a los dems: Si tiene sntomas de COVID-19, tome medidas para evitar que el virus se propague a otras personas.  Si cree que tiene una infeccin por COVID-19, comunquese de inmediato con su mdico. Informe al equipo de atencin mdica que cree que puede tener una infeccin por el COVID-19.  Qudese en su casa. Salga de su casa solo para buscar atencin mdica. No utilice el transporte pblico.  No viaje mientras est enfermo.  Lvese las manos frecuentemente con agua y jabn durante 20segundos. Usar desinfectante para manos con alcohol si no dispone de agua y jabn.  Mantngase alejado de quienes vivan con usted. Permita que los miembros de la familia sanos cuiden a los nios y las mascotas, si es posible. Si tiene que cuidar a los nios o las mascotas, lvese las manos con frecuencia y use un barbijo. Si es posible, permanezca en su habitacin, separado de los dems. Utilice un  bao diferente.  Asegrese de que todas las personas que viven en su casa se laven bien las manos y con frecuencia.  Tosa o estornude en un pauelo de papel o sobre su manga o codo. No tosa o estornude al aire ni se cubra la boca o la nariz con la mano.  Use un barbijo de tela o una mascarilla facial. Asegrese de que le cubra la nariz y la boca. Dnde buscar ms informacin  Centers for Disease Control and Prevention (Centros para el Control y la Prevencin de Enfermedades): www.cdc.gov/coronavirus/2019-ncov/index.html  World Health Organization (Organizacin Mundial de la Salud): www.who.int/health-topics/coronavirus Comunquese con un mdico si:  Vive o ha viajado a una zona donde el COVID-19 es un riesgo y tiene sntomas de infeccin.  Ha tenido contacto con alguien que tiene COVID-19 y usted tiene sntomas de infeccin. Solicite ayuda inmediatamente si:  Tiene dificultad para respirar.  Siente dolor u opresin en el pecho.  Experimenta confusin.  Tiene las uas de los dedos y los labios de color azulado.  Tiene dificultad para despertarse.  Los sntomas empeoran. Estos sntomas pueden representar un problema grave que constituye una emergencia. No espere   a ver si los sntomas desaparecen. Solicite atencin mdica de inmediato. Comunquese con el servicio de emergencias de su localidad (911 en los [place]). No conduzca por sus propios medios [employer] hospital. Informe al personal mdico de emergencias si cree que tiene COVID-19. Resumen  El COVID-19 es una infeccin respiratoria causada por un virus. Tambin se conoce como enfermedad por coronavirus o nuevo coronavirus. Puede causar infecciones graves, como neumona, sndrome de dificultad respiratoria aguda, insuficiencia respiratoria aguda o sepsis.  El virus que causa el COVID-19 es contagioso. Esto significa que puede transmitirse de [place] persona a otra a travs de las gotitas que se despiden al respirar, Heritage manager,  cantar, toser y Engineering geologist.  Es ms probable que desarrolle una enfermedad grave si tiene 50 aos o ms, tiene el sistema inmunitario dbil, vive en un hogar de ancianos o tiene una enfermedad crnica.  No hay ningn medicamento para tratar el COVID-19. El mdico le informar sobre las maneras de tratar los sntomas.  Tome medidas para protegerse y Conservator, museum/gallery a los Merchandiser, retail las infecciones. Lvese las manos con frecuencia y desinfecte los objetos y las superficies que se tocan con frecuencia todos [place]. Mantngase alejado de las personas que estn enfermas y use un barbijo si est enfermo. Esta informacin no tiene Theme park manager el consejo del mdico. Asegrese de hacerle al mdico cualquier pregunta que tenga. Document Revised: 04/30/2019 Document Reviewed: 08/23/2018 Elsevier Patient Education  2020 Elsevier Inc.  COVID-19: Cmo protegerse y proteger a los dems COVID-19: How to Protect Yourself and Others Sepa cmo se propaga  Actualmente, no existe ninguna vacuna para prevenir la enfermedad por coronavirus 2019 (COVID-19).  La mejor forma de prevenir la enfermedad es evitar exponerse a este virus.  Se cree que el virus se transmite principalmente de [place] persona a [place]. ? [employer] que estn en contacto directo entre s (a una distancia inferior a 6 pies [1.18m]). ? A travs de las gotitas respiratorias producidas cuando una persona infectada tose, estornuda o habla. ? Estas gotitas pueden caer en la boca o en la nariz de las personas que estn cerca o pueden ser [employer] pulmones. ? El COVID-19 puede ser transmitido por personas que no presentan sntomas. Lo que todos deben hacer [employer] las manos con frecuencia  Lvese las manos con frecuencia con agua y jabn durante al menos 20 segundos, especialmente despus de Oceanographer en un lugar pblico o despus de sonarse la nariz, toser o estornudar.  Si no dispone de [place] y [place], use un desinfectante  de manos que contenga al menos un 60% de alcohol. Cubra todas las superficies de las manos y frtelas hasta que se sientan secas.  No se toque los ojos, la nariz y la boca sin antes lavarse las manos. Evite el contacto cercano  Limite el contacto directo con otras personas tanto como sea posible.  Evite el contacto cercano con personas que estn enfermas.  Establezca distancia entre usted y [employer]. ? Recuerde que [employer] no tienen sntomas pueden transmitir el virus. ? Esto es Software engineer para las personas que tienen ms riesgo de [email or web address] Cbrase la boca y la nariz con una mascarilla cuando est cerca de otras personas  Puede transmitir el COVID-19 a otras personas aunque no se sienta enfermo.  Todas las Statistician en lugares pblicos y cuando estn con otras que no vivan en su casa, especialmente cuando el distanciamiento social sea difcil de [place]. ? Las  mascarillas no deben colocarse a nios menores de 2 aos de [address], a las personas que tienen problemas respiratorios o que estn inconscientes, incapacitadas o que por algn motivo no puedan quitarse la mascarilla sin [place].  El propsito de Nurse, adult es proteger a Economist en caso de que usted est infectado.  NO utilice las [employer] a los trabajadores de [employer].  Contine manteniendo una distancia aproximada de 6 pies (1.50m) entre usted y [employer]. La mascarilla no reemplaza el distanciamiento social. Cbrase al toser y estornudar  Al toser o al estornudar, siempre cbrase la boca y la nariz con un pauelo descartable o use el interior del codo.  Deseche los pauelos descartables usados en la basura.  Inmediatamente, lvese las manos con agua y [place] durante al menos 20segundos. Si no dispone de agua y [place], [employer] con un desinfectante de  manos que contenga al menos un 60% de alcohol. [place]pie y desinfecte  [place] Y desinfecte las superficies que se tocan con frecuencia todos los [address]  [address]. Esto incluye mesas, picaportes, interruptores de luz, encimeras, mangos, escritorios, telfonos, teclados, inodoros, grifos y lavabos. [email or web address]  Si las superficies estn sucias, lmpielas: Use detergente o jabn y agua antes de la desinfeccin.  Luego, use un desinfectante domstico. Puede consultar una lista de los desinfectantes domsticos registrados en Financial controller (EPA) (Agencia de Proteccin Ambiental) aqu. [email or web address] 03/11/2019 Esta informacin no tiene Theme park manager el consejo del mdico. Asegrese de hacerle al mdico cualquier pregunta que tenga. Document Revised: 03/25/2019 Document Reviewed: 01/16/2019 Elsevier Patient Education  2020 [employer]. What types of side effects do monoclonal antibody drugs cause?  Common side effects  In general, the more common side effects caused by monoclonal antibody drugs include: . Allergic reactions, such as hives or itching . Flu-like signs and symptoms, including chills, fatigue, fever, and muscle aches and pains . Nausea, vomiting . Diarrhea . Skin rashes . Low blood pressure   The CDC is recommending patients who receive monoclonal antibody treatments wait at least 90 days before being vaccinated.  Currently, there are no data on the safety and efficacy of mRNA COVID-19 vaccines in persons who received monoclonal antibodies or convalescent plasma as part of COVID-19 treatment. Based on the estimated half-life of such therapies as well as evidence suggesting that reinfection is uncommon in the 90 days after initial infection, vaccination should be deferred for at least 90 days, as a precautionary measure until additional information becomes available, to avoid interference of the  antibody treatment with vaccine-induced immune responses. 10 Things You Can Do to Manage Your COVID-19 Symptoms at Home If you have possible or confirmed COVID-19: 1. Stay home from work and school. And stay away from other public places. If you must go out, avoid using any kind of public transportation, ridesharing, or taxis. 2. Monitor your symptoms carefully. If your symptoms get worse, call your healthcare provider immediately. 3. Get rest and stay hydrated. 4. If you have a medical appointment, call the healthcare provider ahead of time and tell them that you have or may have COVID-19. 5. For medical emergencies, call 911 and notify the dispatch personnel that you have or may have COVID-19. 6. Cover your cough and sneezes with a tissue or use the inside of your elbow. 7. Wash your hands often with soap and water for at least 20 seconds or clean your hands with an alcohol-based hand sanitizer that contains at least 60% alcohol. 8. As much as possible,  stay in a specific room and away from other people in your home. Also, you should use a separate bathroom, if available. If you need to be around other people in or outside of the home, wear a mask. 9. Avoid sharing personal items with other people in your household, like dishes, towels, and bedding. 10. Clean all surfaces that are touched often, like counters, tabletops, and doorknobs. Use household cleaning sprays or wipes according to the label instructions. [email or web address] 01/07/2019 This information is not intended to replace advice given to you by your health care provider. Make sure you discuss any questions you have with your health care provider. Document Revised: 06/11/2019 Document Reviewed: 06/11/2019 Elsevier Patient Education  2020 [employer].

## 2020-04-13 ENCOUNTER — Other Ambulatory Visit: Payer: Self-pay

## 2020-04-13 ENCOUNTER — Inpatient Hospital Stay (HOSPITAL_BASED_OUTPATIENT_CLINIC_OR_DEPARTMENT_OTHER)
Admission: EM | Admit: 2020-04-13 | Discharge: 2020-04-19 | DRG: 177 | Disposition: A | Discharge status: Home Health | Payer: Medicare Other | Source: Ambulatory Visit | Attending: Internal Medicine | Admitting: Internal Medicine

## 2020-04-13 ENCOUNTER — Encounter (HOSPITAL_BASED_OUTPATIENT_CLINIC_OR_DEPARTMENT_OTHER): Payer: Self-pay | Admitting: *Deleted

## 2020-04-13 ENCOUNTER — Emergency Department (HOSPITAL_BASED_OUTPATIENT_CLINIC_OR_DEPARTMENT_OTHER): Payer: Medicare Other

## 2020-04-13 DIAGNOSIS — U071 COVID-19: Principal | ICD-10-CM | POA: Diagnosis present

## 2020-04-13 DIAGNOSIS — N179 Acute kidney failure, unspecified: Secondary | ICD-10-CM | POA: Diagnosis present

## 2020-04-13 DIAGNOSIS — Z79890 Hormone replacement therapy (postmenopausal): Secondary | ICD-10-CM

## 2020-04-13 DIAGNOSIS — J9601 Acute respiratory failure with hypoxia: Secondary | ICD-10-CM | POA: Diagnosis present

## 2020-04-13 DIAGNOSIS — E039 Hypothyroidism, unspecified: Secondary | ICD-10-CM | POA: Diagnosis present

## 2020-04-13 DIAGNOSIS — N189 Chronic kidney disease, unspecified: Secondary | ICD-10-CM | POA: Diagnosis present

## 2020-04-13 DIAGNOSIS — J1282 Pneumonia due to coronavirus disease 2019: Secondary | ICD-10-CM | POA: Diagnosis present

## 2020-04-13 DIAGNOSIS — Z01818 Encounter for other preprocedural examination: Secondary | ICD-10-CM

## 2020-04-13 DIAGNOSIS — N390 Urinary tract infection, site not specified: Secondary | ICD-10-CM | POA: Diagnosis present

## 2020-04-13 DIAGNOSIS — M109 Gout, unspecified: Secondary | ICD-10-CM | POA: Diagnosis present

## 2020-04-13 DIAGNOSIS — Z79899 Other long term (current) drug therapy: Secondary | ICD-10-CM

## 2020-04-13 DIAGNOSIS — F411 Generalized anxiety disorder: Secondary | ICD-10-CM | POA: Diagnosis present

## 2020-04-13 DIAGNOSIS — Z23 Encounter for immunization: Secondary | ICD-10-CM

## 2020-04-13 DIAGNOSIS — I129 Hypertensive chronic kidney disease with stage 1 through stage 4 chronic kidney disease, or unspecified chronic kidney disease: Secondary | ICD-10-CM | POA: Diagnosis present

## 2020-04-13 DIAGNOSIS — E871 Hypo-osmolality and hyponatremia: Secondary | ICD-10-CM | POA: Diagnosis present

## 2020-04-13 DIAGNOSIS — J159 Unspecified bacterial pneumonia: Secondary | ICD-10-CM | POA: Diagnosis present

## 2020-04-13 LAB — CBC WITH DIFFERENTIAL/PLATELET
Abs Immature Granulocytes: 0.3 10*3/uL — ABNORMAL HIGH (ref 0.00–0.07)
Basophils Absolute: 0 10*3/uL (ref 0.0–0.1)
Basophils Relative: 0 %
Eosinophils Absolute: 0 10*3/uL (ref 0.0–0.5)
Eosinophils Relative: 0 %
HCT: 40.8 % (ref 39.0–52.0)
Hemoglobin: 14.1 g/dL (ref 13.0–17.0)
Immature Granulocytes: 2 %
Lymphocytes Relative: 6 %
Lymphs Abs: 0.9 10*3/uL (ref 0.7–4.0)
MCH: 29.8 pg (ref 26.0–34.0)
MCHC: 34.6 g/dL (ref 30.0–36.0)
MCV: 86.3 fL (ref 80.0–100.0)
Monocytes Absolute: 0.9 10*3/uL (ref 0.1–1.0)
Monocytes Relative: 6 %
Neutro Abs: 12.4 10*3/uL — ABNORMAL HIGH (ref 1.7–7.7)
Neutrophils Relative %: 86 %
Platelets: 213 10*3/uL (ref 150–400)
RBC: 4.73 MIL/uL (ref 4.22–5.81)
RDW: 13 % (ref 11.5–15.5)
WBC: 14.5 10*3/uL — ABNORMAL HIGH (ref 4.0–10.5)
nRBC: 0 % (ref 0.0–0.2)

## 2020-04-13 LAB — COMPREHENSIVE METABOLIC PANEL
ALT: 33 U/L (ref 0–44)
AST: 45 U/L — ABNORMAL HIGH (ref 15–41)
Albumin: 3.2 g/dL — ABNORMAL LOW (ref 3.5–5.0)
Alkaline Phosphatase: 37 U/L — ABNORMAL LOW (ref 38–126)
Anion gap: 11 (ref 5–15)
BUN: 28 mg/dL — ABNORMAL HIGH (ref 8–23)
CO2: 19 mmol/L — ABNORMAL LOW (ref 22–32)
Calcium: 8.3 mg/dL — ABNORMAL LOW (ref 8.9–10.3)
Chloride: 100 mmol/L (ref 98–111)
Creatinine, Ser: 1.48 mg/dL — ABNORMAL HIGH (ref 0.61–1.24)
GFR calc non Af Amer: 50 mL/min — ABNORMAL LOW (ref >60)
Glucose, Bld: 162 mg/dL — ABNORMAL HIGH (ref 70–99)
Potassium: 4.4 mmol/L (ref 3.5–5.1)
Sodium: 130 mmol/L — ABNORMAL LOW (ref 135–145)
Total Bilirubin: 0.5 mg/dL (ref 0.3–1.2)
Total Protein: 7.1 g/dL (ref 6.5–8.1)

## 2020-04-13 LAB — LACTIC ACID, PLASMA: Lactic Acid, Venous: 1.6 mmol/L (ref 0.5–1.9)

## 2020-04-13 LAB — D-DIMER, QUANTITATIVE: D-Dimer, Quant: 0.65 ug/mL-FEU — ABNORMAL HIGH (ref 0.00–0.50)

## 2020-04-13 NOTE — ED Provider Notes (Addendum)
[facility] EMERGENCY DEPARTMENT Provider Note   CSN: [id number] Arrival date & time: 04/13/20  2153     History Chief Complaint  Patient presents with  . SOB     covid +    Edwin Schneider is a 64 y.o. male.  Patient is a 64 year old male presenting with complaints of shortness of breath.  Patient diagnosed 3 days ago with COVID-19.  He received his monoclonal antibody infusion yesterday and was doing well.  Today he became more short of breath and had low oxygen saturations at home.  He arrived here with saturations of 77%.  He denies chest pain.  He does report fever and chills.  Patient has not received Covid vaccination.  The history is provided by the patient.       Past Medical History:  Diagnosis Date  . Gout   . Thyroid disease     Patient Active Problem List   Diagnosis Date Noted  . Fall 02/16/2014  . Concussion 02/16/2014  . Ileus, postoperative (HCC) 02/16/2014  . Multiple pelvic fractures (HCC) 02/11/2014  . HYPERLIPIDEMIA 02/27/2010  . LEG PAIN, BILATERAL 02/27/2010  . ANXIETY STATE, UNSPECIFIED 01/26/2010  . ABDOMINAL PAIN, EPIGASTRIC 05/13/2009  . GERD 02/28/2009  . ALLERGIC RHINITIS 09/27/2008  . DIZZINESS 09/27/2008  . Lateral epicondylitis  of elbow 06/09/2008  . UTI 09/10/2007  . ABDOMINAL PAIN, LEFT UPPER QUADRANT 09/10/2007  . HYPOTHYROIDISM 04/03/2007  . GOUT NOS 01/31/2007    Past Surgical History:  Procedure Laterality Date  . KNEE SURGERY    . ORIF PELVIC FRACTURE N/A 02/12/2014   Procedure: OPEN REDUCTION INTERNAL FIXATION (ORIF) PELVIC RING;  Surgeon: Budd Palmer, MD;  Location: [facility] OR;  Service: Orthopedics;  Laterality: N/A;       Family History  Problem Relation Age of Onset  . Cancer Mother   . Cancer Father     Social History   Tobacco Use  . Smoking status: Never Smoker  Substance Use Topics  . Alcohol use: No  . Drug use: No    Home Medications Prior to Admission medications   Medication Sig  Start Date End Date Taking? Authorizing Provider  acetaminophen (TYLENOL) 500 MG tablet Take 2 tablets (1,000 mg total) by mouth every 6 (six) hours as needed (Pain). 02/17/14   Freeman Caldron, PA-C  allopurinol (ZYLOPRIM) 300 MG tablet Take 300 mg by mouth daily.    [provider]  enoxaparin (LOVENOX) 30 MG/0.3ML injection Inject 0.3 mLs (30 mg total) into the skin every 12 (twelve) hours. 02/17/14   Freeman Caldron, PA-C  levothyroxine (SYNTHROID, LEVOTHROID) 88 MCG tablet Take 88 mcg by mouth daily before breakfast.    [provider]  traMADol (ULTRAM) 50 MG tablet Take 1-2 tablets (50-100 mg total) by mouth every 6 (six) hours as needed (More severe pain). 02/17/14   Freeman Caldron, PA-C    Allergies    Patient has no known allergies.  Review of Systems   Review of Systems  All other systems reviewed and are negative.   Physical Exam Updated Vital Signs BP 105/75   Pulse 83   Temp 98.9 F (37.2 C)   Resp (!) 26   Ht 5\' 7"  (1.702 m)   Wt 85 kg   SpO2 (!) 77%   BMI 29.35 kg/m   Physical Exam Vitals and nursing note reviewed.  Constitutional:      General: He is not in acute distress.    Appearance: He  is well-developed. He is not diaphoretic.  HENT:     Head: Normocephalic and atraumatic.  Cardiovascular:     Rate and Rhythm: Normal rate and regular rhythm.     Heart sounds: No murmur heard.  No friction rub.  Pulmonary:     Effort: Pulmonary effort is normal. No respiratory distress.     Breath sounds: Rales present. No wheezing.  Abdominal:     General: Bowel sounds are normal. There is no distension.     Palpations: Abdomen is soft.     Tenderness: There is no abdominal tenderness.  Musculoskeletal:        General: Normal range of motion.     Cervical back: Normal range of motion and neck supple.  Skin:    General: Skin is warm and dry.  Neurological:     Mental Status: He is alert and oriented to person, place, and time.      Coordination: Coordination normal.     ED Results / Procedures / Treatments   Labs (all labs ordered are listed, but only abnormal results are displayed) Labs Reviewed  CULTURE, BLOOD (ROUTINE X 2)  CULTURE, BLOOD (ROUTINE X 2)  LACTIC ACID, PLASMA  LACTIC ACID, PLASMA  CBC WITH DIFFERENTIAL/PLATELET  COMPREHENSIVE METABOLIC PANEL  D-DIMER, QUANTITATIVE (NOT AT [facility])  PROCALCITONIN  LACTATE DEHYDROGENASE  FERRITIN  TRIGLYCERIDES  FIBRINOGEN  C-REACTIVE PROTEIN    EKG EKG Interpretation  Date/Time:  Wednesday April 13 2020 22:38:42 EDT Ventricular Rate:  90 PR Interval:    QRS Duration: 93 QT Interval:  354 QTC Calculation: 434 R Axis:   65 Text Interpretation: Sinus rhythm Low voltage, precordial leads No significant change since 02/15/2003 Confirmed by Geoffery Lyons ([id number]) on 04/13/2020 11:38:38 PM   Radiology No results found.  Procedures Procedures (including critical care time)  Medications Ordered in ED Medications - No data to display  ED Course  I have reviewed the triage vital signs and the nursing notes.  Pertinent labs & imaging results that were available during my care of the patient were reviewed by me and considered in my medical decision making (see chart for details).    MDM Rules/Calculators/A&P  Patient with history of hypothyroidism, hyperlipidemia presenting with complaints of shortness of breath.  He was diagnosed with COVID-19 3 days ago and received his monoclonal antibody infusion yesterday.  He began feeling more short of breath this afternoon and was found to have low oxygen saturations at home.  He arrived here with oxygen saturations of 78% and a significant oxygen requirement of 8 L nasal cannula to maintain saturations greater than 90%.  Patient's chest x-ray is consistent with COVID-19 pneumonia.  Laboratory studies thus far unremarkable.  Due to the high oxygen requirement, patient will require admission.  Care will be signed  out to the oncoming provider at shift change.  He will discuss with the hospitalist and arrangements for admission will be made.  CRITICAL CARE Performed by: Geoffery Lyons Total critical care time: 45 minutes Critical care time was exclusive of separately billable procedures and treating other patients. Critical care was necessary to treat or prevent imminent or life-threatening deterioration. Critical care was time spent personally by me on the following activities: development of treatment plan with patient and/or surrogate as well as nursing, discussions with consultants, evaluation of patient's response to treatment, examination of patient, obtaining history from patient or surrogate, ordering and performing treatments and interventions, ordering and review of laboratory studies, ordering and review of radiographic studies,  pulse oximetry and re-evaluation of patient's condition.  Edwin Schneider was evaluated in Emergency Department on 04/13/2020 for the symptoms described in the history of present illness. He was evaluated in the context of the global COVID-19 pandemic, which necessitated consideration that the patient might be at risk for infection with the SARS-CoV-2 virus that causes COVID-19. Institutional protocols and algorithms that pertain to the evaluation of patients at risk for COVID-19 are in a state of rapid change based on information released by regulatory bodies including the CDC and federal and state organizations. These policies and algorithms were followed during the patient's care in the ED.  Final Clinical Impression(s) / ED Diagnoses Final diagnoses:  None    Rx / DC Orders ED Discharge Orders    None       Geoffery Lyons, MD 04/13/20 1950    Geoffery Lyons, MD 04/13/20 2345

## 2020-04-13 NOTE — ED Notes (Signed)
Patient placed on HFNC due to room air oxygen saturations of 78%. Patient placed on 10L HFNC with oxygen saturations of 92%. Will monitor and make changes as necessary. Patient tolerating well.

## 2020-04-13 NOTE — ED Triage Notes (Addendum)
Family translating C/o SOB x 2 days , received " covid infusion " x 1 day ago  Covid positive x 1 week

## 2020-04-14 ENCOUNTER — Encounter (HOSPITAL_COMMUNITY): Payer: Self-pay | Admitting: Internal Medicine

## 2020-04-14 DIAGNOSIS — U071 COVID-19: Principal | ICD-10-CM

## 2020-04-14 DIAGNOSIS — J159 Unspecified bacterial pneumonia: Secondary | ICD-10-CM | POA: Diagnosis present

## 2020-04-14 DIAGNOSIS — N189 Chronic kidney disease, unspecified: Secondary | ICD-10-CM | POA: Diagnosis present

## 2020-04-14 DIAGNOSIS — I129 Hypertensive chronic kidney disease with stage 1 through stage 4 chronic kidney disease, or unspecified chronic kidney disease: Secondary | ICD-10-CM | POA: Diagnosis present

## 2020-04-14 DIAGNOSIS — M109 Gout, unspecified: Secondary | ICD-10-CM | POA: Diagnosis present

## 2020-04-14 DIAGNOSIS — E871 Hypo-osmolality and hyponatremia: Secondary | ICD-10-CM | POA: Diagnosis present

## 2020-04-14 DIAGNOSIS — Z79890 Hormone replacement therapy (postmenopausal): Secondary | ICD-10-CM | POA: Diagnosis not present

## 2020-04-14 DIAGNOSIS — Z01818 Encounter for other preprocedural examination: Secondary | ICD-10-CM | POA: Diagnosis not present

## 2020-04-14 DIAGNOSIS — E039 Hypothyroidism, unspecified: Secondary | ICD-10-CM | POA: Diagnosis present

## 2020-04-14 DIAGNOSIS — N179 Acute kidney failure, unspecified: Secondary | ICD-10-CM | POA: Diagnosis present

## 2020-04-14 DIAGNOSIS — J9601 Acute respiratory failure with hypoxia: Secondary | ICD-10-CM | POA: Diagnosis present

## 2020-04-14 DIAGNOSIS — J1282 Pneumonia due to coronavirus disease 2019: Secondary | ICD-10-CM | POA: Diagnosis present

## 2020-04-14 DIAGNOSIS — Z79899 Other long term (current) drug therapy: Secondary | ICD-10-CM | POA: Diagnosis not present

## 2020-04-14 LAB — CBC
HCT: 38.5 % — ABNORMAL LOW (ref 39.0–52.0)
Hemoglobin: 13 g/dL (ref 13.0–17.0)
MCH: 29.5 pg (ref 26.0–34.0)
MCHC: 33.8 g/dL (ref 30.0–36.0)
MCV: 87.5 fL (ref 80.0–100.0)
Platelets: 203 10*3/uL (ref 150–400)
RBC: 4.4 MIL/uL (ref 4.22–5.81)
RDW: 13.2 % (ref 11.5–15.5)
WBC: 10.3 10*3/uL (ref 4.0–10.5)
nRBC: 0 % (ref 0.0–0.2)

## 2020-04-14 LAB — CREATININE, SERUM
Creatinine, Ser: 1.47 mg/dL — ABNORMAL HIGH (ref 0.61–1.24)
GFR calc non Af Amer: 50 mL/min — ABNORMAL LOW (ref >60)

## 2020-04-14 LAB — MRSA PCR SCREENING: MRSA by PCR: NEGATIVE

## 2020-04-14 LAB — C-REACTIVE PROTEIN: CRP: 13.6 mg/dL — ABNORMAL HIGH (ref <1.0)

## 2020-04-14 LAB — FIBRINOGEN: Fibrinogen: 781 mg/dL — ABNORMAL HIGH (ref 210–475)

## 2020-04-14 LAB — PROCALCITONIN: Procalcitonin: 3.61 ng/mL

## 2020-04-14 LAB — BRAIN NATRIURETIC PEPTIDE: B Natriuretic Peptide: 99.6 pg/mL (ref 0.0–100.0)

## 2020-04-14 LAB — HIV ANTIBODY (ROUTINE TESTING W REFLEX): HIV Screen 4th Generation wRfx: NONREACTIVE

## 2020-04-14 LAB — FERRITIN: Ferritin: 1341 ng/mL — ABNORMAL HIGH (ref 24–336)

## 2020-04-14 LAB — TRIGLYCERIDES: Triglycerides: 165 mg/dL — ABNORMAL HIGH (ref <150)

## 2020-04-14 LAB — LACTATE DEHYDROGENASE: LDH: 429 U/L — ABNORMAL HIGH (ref 98–192)

## 2020-04-14 MED ORDER — SODIUM CHLORIDE 0.9 % IV SOLN
500.0000 mg | INTRAVENOUS | Status: DC
  Administered 2020-04-14 – 2020-04-15 (×2): 500 mg via INTRAVENOUS
  Filled 2020-04-14 (×2): qty 500

## 2020-04-14 MED ORDER — CALCIUM CARBONATE ANTACID 500 MG PO CHEW: 1.0000 | CHEWABLE_TABLET | ORAL | Status: DC | PRN

## 2020-04-14 MED ORDER — ZINC SULFATE 220 (50 ZN) MG PO CAPS
220.0000 mg | ORAL_CAPSULE | Freq: Every day | ORAL | Status: DC
  Administered 2020-04-14 – 2020-04-19 (×6): 220 mg via ORAL
  Filled 2020-04-14 (×7): qty 1

## 2020-04-14 MED ORDER — SODIUM CHLORIDE 0.9 % IV SOLN
100.0000 mg | INTRAVENOUS | Status: AC
  Administered 2020-04-14 (×2): 100 mg via INTRAVENOUS
  Filled 2020-04-14: qty 20

## 2020-04-14 MED ORDER — METHYLPREDNISOLONE SODIUM SUCC 125 MG IJ SOLR
80.0000 mg | Freq: Two times a day (BID) | INTRAMUSCULAR | Status: DC
  Administered 2020-04-14 – 2020-04-18 (×9): 80 mg via INTRAVENOUS
  Filled 2020-04-14 (×9): qty 2

## 2020-04-14 MED ORDER — LEVOTHYROXINE SODIUM 25 MCG PO TABS
137.0000 ug | ORAL_TABLET | Freq: Every day | ORAL | Status: DC
  Administered 2020-04-15 – 2020-04-19 (×5): 137 ug via ORAL
  Filled 2020-04-14 (×5): qty 1

## 2020-04-14 MED ORDER — DEXAMETHASONE SODIUM PHOSPHATE 10 MG/ML IJ SOLN
6.0000 mg | INTRAMUSCULAR | Status: DC
  Administered 2020-04-14: 6 mg via INTRAVENOUS
  Filled 2020-04-14: qty 1

## 2020-04-14 MED ORDER — ASCORBIC ACID 500 MG PO TABS
500.0000 mg | ORAL_TABLET | Freq: Every day | ORAL | Status: DC
  Administered 2020-04-14 – 2020-04-19 (×6): 500 mg via ORAL
  Filled 2020-04-14 (×7): qty 1

## 2020-04-14 MED ORDER — ALBUTEROL SULFATE HFA 108 (90 BASE) MCG/ACT IN AERS
2.0000 | INHALATION_SPRAY | Freq: Four times a day (QID) | RESPIRATORY_TRACT | Status: DC | PRN
  Administered 2020-04-14 – 2020-04-16 (×3): 2 via RESPIRATORY_TRACT
  Filled 2020-04-14: qty 6.7

## 2020-04-14 MED ORDER — CALCIUM CARBONATE ANTACID 500 MG PO CHEW
400.0000 mg | CHEWABLE_TABLET | ORAL | Status: DC | PRN
  Administered 2020-04-14 – 2020-04-17 (×5): 400 mg via ORAL
  Filled 2020-04-14 (×5): qty 2

## 2020-04-14 MED ORDER — ALBUTEROL SULFATE HFA 108 (90 BASE) MCG/ACT IN AERS
2.0000 | INHALATION_SPRAY | Freq: Four times a day (QID) | RESPIRATORY_TRACT | Status: DC
  Administered 2020-04-14 (×2): 2 via RESPIRATORY_TRACT
  Filled 2020-04-14 (×2): qty 6.7

## 2020-04-14 MED ORDER — GUAIFENESIN-DM 100-10 MG/5ML PO SYRP
10.0000 mL | ORAL_SOLUTION | ORAL | Status: DC | PRN
  Administered 2020-04-15: 10 mL via ORAL
  Filled 2020-04-14 (×2): qty 10

## 2020-04-14 MED ORDER — ALLOPURINOL 300 MG PO TABS
300.0000 mg | ORAL_TABLET | Freq: Every day | ORAL | Status: DC
  Administered 2020-04-14 – 2020-04-19 (×6): 300 mg via ORAL
  Filled 2020-04-14 (×6): qty 1

## 2020-04-14 MED ORDER — LACTATED RINGERS IV SOLN: INTRAVENOUS | Status: DC

## 2020-04-14 MED ORDER — BARICITINIB 2 MG PO TABS
2.0000 mg | ORAL_TABLET | Freq: Every day | ORAL | Status: DC
  Administered 2020-04-14 – 2020-04-19 (×6): 2 mg via ORAL
  Filled 2020-04-14 (×6): qty 1

## 2020-04-14 MED ORDER — SODIUM CHLORIDE 0.9 % IV SOLN
200.0000 mg | Freq: Once | INTRAVENOUS | Status: DC
  Filled 2020-04-14: qty 40

## 2020-04-14 MED ORDER — HYDRALAZINE HCL 25 MG PO TABS: 25.0000 mg | ORAL_TABLET | Freq: Three times a day (TID) | ORAL | Status: DC | PRN

## 2020-04-14 MED ORDER — ENOXAPARIN SODIUM 40 MG/0.4ML ~~LOC~~ SOLN
40.0000 mg | SUBCUTANEOUS | Status: DC
  Administered 2020-04-14 – 2020-04-19 (×6): 40 mg via SUBCUTANEOUS
  Filled 2020-04-14 (×6): qty 0.4

## 2020-04-14 MED ORDER — SODIUM CHLORIDE 0.9 % IV SOLN
100.0000 mg | Freq: Every day | INTRAVENOUS | Status: AC
  Administered 2020-04-14 – 2020-04-17 (×4): 100 mg via INTRAVENOUS
  Filled 2020-04-14 (×4): qty 20

## 2020-04-14 MED ORDER — SODIUM CHLORIDE 0.9 % IV SOLN: 100.0000 mg | Freq: Every day | INTRAVENOUS | Status: DC

## 2020-04-14 MED ORDER — SODIUM CHLORIDE 0.9 % IV SOLN
2.0000 g | INTRAVENOUS | Status: AC
  Administered 2020-04-14 – 2020-04-18 (×5): 2 g via INTRAVENOUS
  Filled 2020-04-14 (×5): qty 20

## 2020-04-14 MED ORDER — LEVOTHYROXINE SODIUM 88 MCG PO TABS: 88.0000 ug | ORAL_TABLET | Freq: Every day | ORAL | Status: DC

## 2020-04-14 NOTE — H&P (Signed)
History and Physical   Edwin Schneider UXN:[id number] DOB: Sep 07, 1955 DOA: 04/13/2020  PCP: Patient, No Pcp Per  Patient coming from: transfer from [facility] ED  I have personally briefly reviewed patient's old medical records in [facility] EMR  Chief Concern: shortness of breath with known positive covid test  HPI: Spanish interpreter was used during the history and physical exam portion   Edwin Schneider is a 64 y.o. male with medical history of hypertension on losartan 50 mg daily, hypothyroid on 137 mcg levothyroxine, gout on allopurinol who presented as a transfer from [facility] for progressively worsening COVID 19 infection require high flow nasal cannula.   He works [place] a [place] and reports that he is exposed to numerous people who do not wear their mask. He states that he is not vaccinated for covid 19.   He states the he was experiencing shortness of breath, chest discomfort with inhalation, fever and chills for approximately two days prior to presentation. He checked his spO2 at home and noted that it went down to 72 prompting him to present to the ED. He reports elevated temperature at home with high of 102.4 F. He reports that he did not try anything at home and normally does not require supplemental O2.   Per chart review, he was diagnosed with COVID, on 04/09/20 and received monoclonal antibody infusion on 04/12/20.  Edwin Schneider will be admitted to inpatient.   ED Course:  ED note reviewed and patient transferred due to high oxygen requirement.   Review of Systems: negative for headache, vision changes, nausea, vomiting, changes to taste, smell, abdominal pain, diarrhea, dysuria, hematuria, weight loss or weight gain. Endorsed decreased appetite x 3 days, shortness of breath, and chest discomfort with inhalation.   Past Medical History:  Diagnosis Date  . Gout   . Thyroid disease    Past Surgical History:  Procedure Laterality Date  . KNEE SURGERY    .  ORIF PELVIC FRACTURE N/A 02/12/2014   Procedure: OPEN REDUCTION INTERNAL FIXATION (ORIF) PELVIC RING;  Surgeon: Budd Palmer, MD;  Location: [facility] OR;  Service: Orthopedics;  Laterality: N/A;   Social History:  reports that he has never smoked. He has never used smokeless tobacco. He reports that he does not drink alcohol and does not use drugs.  No Known Allergies  Family History  Problem Relation Age of Onset  . Cancer Mother   . Cancer Father    Family history: Family history reviewed and not pertinent  Prior to Admission medications   Medication Sig Start Date End Date Taking? Authorizing Provider  acetaminophen (TYLENOL) 500 MG tablet Take 2 tablets (1,000 mg total) by mouth every 6 (six) hours as needed (Pain). 02/17/14   Freeman Caldron, PA-C  allopurinol (ZYLOPRIM) 300 MG tablet Take 300 mg by mouth daily.    [provider]  enoxaparin (LOVENOX) 30 MG/0.3ML injection Inject 0.3 mLs (30 mg total) into the skin every 12 (twelve) hours. 02/17/14   Freeman Caldron, PA-C  levothyroxine (SYNTHROID, LEVOTHROID) 88 MCG tablet Take 88 mcg by mouth daily before breakfast.    [provider]  traMADol (ULTRAM) 50 MG tablet Take 1-2 tablets (50-100 mg total) by mouth every 6 (six) hours as needed (More severe pain). 02/17/14   Freeman Caldron, PA-C   Physical Exam: Vitals:   04/14/20 0115 04/14/20 0130 04/14/20 0247 04/14/20 0403  BP:  112/81 111/81 113/77  Pulse: 76 74 75 73  Resp: Marland Kitchen)  24 (!) 34 (!) 23 20  Temp:   98.2 F (36.8 C) 98.5 F (36.9 C)  TempSrc:   Oral Oral  SpO2: 99% 99% 94% 98%  Weight:   82.4 kg   Height:   5\' 7"  (1.702 m)    Constitutional: NAD, calm Eyes: PERRL, lids and conjunctivae normal ENMT: Mucous membranes are moist. Posterior pharynx clear of any exudate or lesions.Normal dentition.  Neck: normal, supple, no masses, no thyromegaly Respiratory: clear to auscultation bilaterally, no wheezing, no crackles. Normal respiratory effort.  accessory muscle use with prolonged conversation. Cannula device noted Cardiovascular: Regular rate and rhythm, no murmurs / rubs / gallops. No extremity edema. 2+ pedal pulses. No carotid bruits.  Abdomen: no tenderness, no masses palpated. No hepatosplenomegaly. Bowel sounds positive.  Musculoskeletal: no clubbing / cyanosis. No joint deformity upper and lower extremities. Good ROM, no contractures. Normal muscle tone.  Skin: no rashes, lesions, ulcers. No induration Neurologic: CN 2-12 grossly intact. Sensation intact, DTR normal. Strength 5/5 in all 4.  Psychiatric: Normal judgment and insight. Alert and oriented x 3. Normal mood.   Labs on Admission: I have personally reviewed following labs and imaging studies  CBC: Recent Labs  Lab 04/13/20 2220  WBC 14.5*  NEUTROABS 12.4*  HGB 14.1  HCT 40.8  MCV 86.3  PLT 213   Basic Metabolic Panel: Recent Labs  Lab 04/13/20 2220  NA 130*  K 4.4  CL 100  CO2 19*  GLUCOSE 162*  BUN 28*  CREATININE 1.48*  CALCIUM 8.3*   GFR: Estimated Creatinine Clearance: 52.5 mL/min (A) (by C-G formula based on SCr of 1.48 mg/dL (H)). Liver Function Tests: Recent Labs  Lab 04/13/20 2220  AST 45*  ALT 33  ALKPHOS 37*  BILITOT 0.5  PROT 7.1  ALBUMIN 3.2*   No results for input(s): LIPASE, AMYLASE in the last 168 hours. No results for input(s): AMMONIA in the last 168 hours. Coagulation Profile: No results for input(s): INR, PROTIME in the last 168 hours. Cardiac Enzymes: No results for input(s): CKTOTAL, CKMB, CKMBINDEX, TROPONINI in the last 168 hours. BNP (last 3 results) No results for input(s): PROBNP in the last 8760 hours. HbA1C: No results for input(s): HGBA1C in the last 72 hours. CBG: No results for input(s): GLUCAP in the last 168 hours. Lipid Profile: Recent Labs    04/13/20 2220  TRIG 165*   Thyroid Function Tests: No results for input(s): TSH, T4TOTAL, FREET4, T3FREE, THYROIDAB in the last 72 hours. Anemia  Panel: Recent Labs    04/13/20 2240  FERRITIN 1,341*   Urine analysis:    Component Value Date/Time   COLORURINE yellow 06/09/2008 0000   APPEARANCEUR Clear 06/09/2008 0000   LABSPEC <1.005 06/09/2008 0000   PHURINE 7.0 06/09/2008 0000   HGBUR negative 06/09/2008 0000   BILIRUBINUR negative 06/09/2008 0000   UROBILINOGEN negative 06/09/2008 0000   NITRITE negative 06/09/2008 0000   Radiological Exams on Admission: Personally reviewed and I agree with radiologist reading as below.  DG Chest Port 1 View  Result Date: 04/13/2020 CLINICAL DATA:  Shortness of breath for 2 days.  COVID positive. EXAM: PORTABLE CHEST 1 VIEW COMPARISON:  02/11/2014 FINDINGS: Stable heart size and mediastinal contours. Patchy heterogeneous bilateral airspace opacities in a lower lung zone predominant distribution. No pneumothorax or evidence of pneumomediastinum. No significant pleural effusion. No acute osseous abnormalities are seen. IMPRESSION: Patchy heterogeneous bilateral airspace opacities consistent with COVID pneumonia. Electronically Signed   By: 04/13/2014.D.  On: 04/13/2020 22:42   EKG: Independently reviewed, showing NSR with rate of 90, no ST-T wave changes.   Assessment/Plan Principal Problem:   COVID-19 virus infection Active Problems:   Hypothyroidism   Anxiety state   UTI   Edwin Schneider is a 34 yom with hypothyroid and gout in remission was a transfer for covid infection requiring high flow nasal cannula.   # COVID 19 infection  - Dexamethasone 6 mg IV daily, remdesivir 100 mg IV to complete 5 day course - Remdesivir per pharmacy consult - Azithromycin and ceftriaxone for superimposed CAP coverage - Baricitinib 2 mg daily (adjusted due to GFR < 60) - Vitamin C and zinc - Albuterol daily - Airborne and contact precaution.  - CBC in the AM  # Patient reports hypertension - currently controlled - Per patient he takes losartan 50 mg daily, however med rec is pending -  Hydralazine 25 mg q8h prn for sbp > 160  # Hypothyroid - resume home levothyroxine 137 mcg daily # recent h/o CTI, abx per above # h/o gout in remission - resumed home allopurinol 300 mg daily  DVT prophylaxis: enoxaparin 40 mg subcu daily Code Status: full Family Communication: called spouse to update, however she does not speak [place]. Disposition Plan: pending clinical course Consults called: pharmacy consulted  Admission status: inpatient  Edwin Schneider N Eiley Mcginnity D.O. [facility]  If 7AM-7PM, please contact day-coverage [email or web address]  04/14/2020, 6:09 AM

## 2020-04-14 NOTE — Progress Notes (Signed)
I have updated this patients spouse, son and daughter Karma Greaser). The daughter should be the main point of contact because she is able to speak/ understand english.

## 2020-04-14 NOTE — Plan of Care (Signed)
Patient is currently resting in bed. Arrived to unit around 0230. Denies pain. VSS. HFNC 9L, will continue to wean. SOB with excretion. Voiding. Not OOB yet. Bed alarm on. Call bell within reach. Interpretor video in room. Patient able to speak little english and converse with staff. Patient stated he would call his son in the AM.   Dr. Leafy Half paged this AM as an FYI to state that patient does not have any blood thinners on board. Provider stated they he was admitting provider only and flow manager should be made aware that patient has arrived to unit. Admissions was made aware, provider remained patients on call doctor. No new orders.   Problem: Education: Goal: Knowledge of General Education information will improve Description: Including pain rating scale, medication(s)/side effects and non-pharmacologic comfort measures Outcome: Progressing   Problem: Health Behavior/Discharge Planning: Goal: Ability to manage health-related needs will improve Outcome: Progressing   Problem: Clinical Measurements: Goal: Ability to maintain clinical measurements within normal limits will improve Outcome: Progressing Goal: Will remain free from infection Outcome: Progressing Goal: Diagnostic test results will improve Outcome: Progressing Goal: Respiratory complications will improve Outcome: Progressing Goal: Cardiovascular complication will be avoided Outcome: Progressing   Problem: Activity: Goal: Risk for activity intolerance will decrease Outcome: Progressing   Problem: Nutrition: Goal: Adequate nutrition will be maintained Outcome: Progressing   Problem: Coping: Goal: Level of anxiety will decrease Outcome: Progressing   Problem: Elimination: Goal: Will not experience complications related to bowel motility Outcome: Progressing Goal: Will not experience complications related to urinary retention Outcome: Progressing   Problem: Pain Managment: Goal: General experience of comfort  will improve Outcome: Progressing   Problem: Safety: Goal: Ability to remain free from injury will improve Outcome: Progressing   Problem: Skin Integrity: Goal: Risk for impaired skin integrity will decrease Outcome: Progressing

## 2020-04-14 NOTE — Progress Notes (Signed)
Edwin Schneider  SEG:[id number] DOB: [birth year] DOA: 04/13/2020 PCP: Patient, No Pcp Per    Brief Narrative:  64 year old with a history of HTN, hypothyroidism, and gout who was admitted from [facility] with progressively worsening COVID to the point of requiring high flow nasal cannula support.  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  He has not been vaccinated against Covid. He was diagnosed with Covid 10/2 and received a monoclonal antibody infusion 10/5. He reported 2 days of shortness of breath and chest discomfort with inhalation with subjective fevers.  He checked his oxygen saturation at home and found it to be 72 and therefore presented to the emergency room.    Significant Events:  10/6 admit via [facility] 10/7 transfer to [facility]  Date of Positive COVID Test:  10/2  Vaccination Status: Unvaccinated against Covid  COVID-19 specific Treatment: Remdesivir 10/6 > Steroid 10/6 > Baricitinib 10/6 >  Antimicrobials:  Azithromycin 10/6 > Ceftriaxone 10/6 >  DVT prophylaxis: Lovenox  Subjective: Follow-up review completed.   Assessment & Plan:  COVID Pneumonia -acute hypoxic respiratory failure Continue remdesivir, steroid, and baricitinib -wean oxygen as able  Recent Labs  Lab 04/13/20 2220 04/13/20 2240  DDIMER 0.65*  --   FERRITIN  --  1,341*  CRP  --  13.6*  ALT 33  --   PROCALCITON 3.61  --     Possible bacterial pneumonia Continue empiric antibiotic in setting of elevated procalcitonin  Acute kidney injury crt 1.48 at admit  Mild hyponatremia  HTN  Hypothyroidism Continue usual Synthroid dose  Gout  Code Status: FULL CODE Family Communication: Spoke with daughter in [place] via telephone in presence of patient Status is: Inpatient  Remains inpatient appropriate because:Inpatient level of care appropriate due to severity of illness   Dispo: The patient is  from: Home              Anticipated d/c is to: Home              Anticipated d/c date is: 3 days              Patient currently is not medically stable to d/c.   Consultants:  none  Objective: Blood pressure 107/75, pulse 73, temperature 98.5 F (36.9 C), temperature source Oral, resp. rate 20, height 5\' 7"  (1.702 m), weight 82.4 kg, SpO2 98 %.  Intake/Output Summary (Last 24 hours) at 04/14/2020 0959 Last data filed at 04/14/2020 06/14/2020 Gross per 24 hour  Intake 722.05 ml  Output 700 ml  Net 22.05 ml   Filed Weights   04/13/20 2204 04/14/20 0247  Weight: 85 kg 82.4 kg    Examination: Examined by [facility] MD earlier today   CBC: Recent Labs  Lab 04/13/20 2220 04/14/20 0659  WBC 14.5* 10.3  NEUTROABS 12.4*  --   HGB 14.1 13.0  HCT 40.8 38.5*  MCV 86.3 87.5  PLT 213 203   Basic Metabolic Panel: Recent Labs  Lab 04/13/20 2220 04/14/20 0659  NA 130*  --   K 4.4  --   CL 100  --   CO2 19*  --   GLUCOSE 162*  --   BUN 28*  --   CREATININE 1.48* 1.47*  CALCIUM 8.3*  --    GFR: Estimated Creatinine Clearance: 52.8 mL/min (A) (by C-G formula based on SCr of 1.47 mg/dL (H)).  Liver Function Tests: Recent Labs  Lab 04/13/20 2220  AST 45*  ALT 33  ALKPHOS 37*  BILITOT 0.5  PROT 7.1  ALBUMIN 3.2*   No results for input(s): LIPASE, AMYLASE in the last 168 hours. No results for input(s): AMMONIA in the last 168 hours.  Coagulation Profile: No results for input(s): INR, PROTIME in the last 168 hours.  Cardiac Enzymes: No results for input(s): CKTOTAL, CKMB, CKMBINDEX, TROPONINI in the last 168 hours.  HbA1C: No results found for: HGBA1C  CBG: No results for input(s): GLUCAP in the last 168 hours.  Recent Results (from the past 240 hour(s))  MRSA PCR Screening     Status: None   Collection Time: 04/14/20  3:44 AM   Specimen: Nasal Mucosa; Nasopharyngeal  Result Value Ref Range Status   MRSA by PCR NEGATIVE NEGATIVE Final    Comment:        The GeneXpert  MRSA Assay (FDA approved for NASAL specimens only), is one component of a comprehensive MRSA colonization surveillance program. It is not intended to diagnose MRSA infection nor to guide or monitor treatment for MRSA infections. Performed at [facility], [address], [place], [place] [address]      Scheduled Meds: . albuterol  2 puff Inhalation Q6H  . allopurinol  300 mg Oral Daily  . vitamin C  500 mg Oral Daily  . baricitinib  2 mg Oral Daily  . dexamethasone (DECADRON) injection  6 mg Intravenous Q24H  . enoxaparin (LOVENOX) injection  40 mg Subcutaneous Q24H  . [START ON 04/15/2020] levothyroxine  137 mcg Oral Q0600  . zinc sulfate  220 mg Oral Daily   Continuous Infusions: . azithromycin 500 mg (04/14/20 0340)  . cefTRIAXone (ROCEPHIN)  IV 2 g (04/14/20 0341)  . lactated ringers 125 mL/hr at 04/14/20 0043  . remdesivir 100 mg in NS 100 mL       LOS: 0 days   Lonia Blood, MD [facility] Office  [phone] Pager - Text Page per Amion  If 7PM-7AM, please contact night-coverage per Amion 04/14/2020, 9:59 AM

## 2020-04-15 DIAGNOSIS — U071 COVID-19: Secondary | ICD-10-CM | POA: Diagnosis not present

## 2020-04-15 LAB — C-REACTIVE PROTEIN: CRP: 4.4 mg/dL — ABNORMAL HIGH (ref <1.0)

## 2020-04-15 LAB — CBC WITH DIFFERENTIAL/PLATELET
Abs Immature Granulocytes: 0.23 10*3/uL — ABNORMAL HIGH (ref 0.00–0.07)
Basophils Absolute: 0 10*3/uL (ref 0.0–0.1)
Basophils Relative: 0 %
Eosinophils Absolute: 0 10*3/uL (ref 0.0–0.5)
Eosinophils Relative: 0 %
HCT: 38.4 % — ABNORMAL LOW (ref 39.0–52.0)
Hemoglobin: 13.1 g/dL (ref 13.0–17.0)
Immature Granulocytes: 2 %
Lymphocytes Relative: 10 %
Lymphs Abs: 1 10*3/uL (ref 0.7–4.0)
MCH: 30.3 pg (ref 26.0–34.0)
MCHC: 34.1 g/dL (ref 30.0–36.0)
MCV: 88.7 fL (ref 80.0–100.0)
Monocytes Absolute: 0.5 10*3/uL (ref 0.1–1.0)
Monocytes Relative: 5 %
Neutro Abs: 8 10*3/uL — ABNORMAL HIGH (ref 1.7–7.7)
Neutrophils Relative %: 83 %
Platelets: 252 10*3/uL (ref 150–400)
RBC: 4.33 MIL/uL (ref 4.22–5.81)
RDW: 13.2 % (ref 11.5–15.5)
WBC: 9.7 10*3/uL (ref 4.0–10.5)
nRBC: 0 % (ref 0.0–0.2)

## 2020-04-15 LAB — FERRITIN: Ferritin: 1219 ng/mL — ABNORMAL HIGH (ref 24–336)

## 2020-04-15 LAB — COMPREHENSIVE METABOLIC PANEL
ALT: 29 U/L (ref 0–44)
AST: 29 U/L (ref 15–41)
Albumin: 2.5 g/dL — ABNORMAL LOW (ref 3.5–5.0)
Alkaline Phosphatase: 36 U/L — ABNORMAL LOW (ref 38–126)
Anion gap: 10 (ref 5–15)
BUN: 29 mg/dL — ABNORMAL HIGH (ref 8–23)
CO2: 22 mmol/L (ref 22–32)
Calcium: 8.5 mg/dL — ABNORMAL LOW (ref 8.9–10.3)
Chloride: 107 mmol/L (ref 98–111)
Creatinine, Ser: 1.54 mg/dL — ABNORMAL HIGH (ref 0.61–1.24)
GFR calc non Af Amer: 47 mL/min — ABNORMAL LOW (ref >60)
Glucose, Bld: 178 mg/dL — ABNORMAL HIGH (ref 70–99)
Potassium: 4.8 mmol/L (ref 3.5–5.1)
Sodium: 139 mmol/L (ref 135–145)
Total Bilirubin: 0.1 mg/dL — ABNORMAL LOW (ref 0.3–1.2)
Total Protein: 6 g/dL — ABNORMAL LOW (ref 6.5–8.1)

## 2020-04-15 LAB — D-DIMER, QUANTITATIVE: D-Dimer, Quant: 0.49 ug/mL-FEU (ref 0.00–0.50)

## 2020-04-15 LAB — PROCALCITONIN: Procalcitonin: 1.54 ng/mL

## 2020-04-15 MED ORDER — AZITHROMYCIN 500 MG PO TABS
500.0000 mg | ORAL_TABLET | Freq: Every day | ORAL | Status: AC
  Administered 2020-04-16 – 2020-04-18 (×3): 500 mg via ORAL
  Filled 2020-04-15 (×3): qty 1

## 2020-04-15 MED ORDER — HYDROCOD POLST-CPM POLST ER 10-8 MG/5ML PO SUER: 5.0000 mL | Freq: Two times a day (BID) | ORAL | Status: DC | PRN

## 2020-04-15 MED ORDER — BENZONATATE 100 MG PO CAPS
200.0000 mg | ORAL_CAPSULE | Freq: Three times a day (TID) | ORAL | Status: AC
  Administered 2020-04-15 – 2020-04-16 (×5): 200 mg via ORAL
  Filled 2020-04-15 (×5): qty 2

## 2020-04-15 NOTE — Plan of Care (Signed)
Patient is currently resting in bed. Denies pain. VSS. Patient currently on 5L [place]. OOB standby. C/o heartburn and indigestion, provider aware, given tums. Call bell within reach.   Problem: Education: Goal: Knowledge of General Education information will improve Description: Including pain rating scale, medication(s)/side effects and non-pharmacologic comfort measures Outcome: Progressing   Problem: Health Behavior/Discharge Planning: Goal: Ability to manage health-related needs will improve Outcome: Progressing   Problem: Clinical Measurements: Goal: Ability to maintain clinical measurements within normal limits will improve Outcome: Progressing Goal: Will remain free from infection Outcome: Progressing Goal: Diagnostic test results will improve Outcome: Progressing Goal: Respiratory complications will improve Outcome: Progressing Goal: Cardiovascular complication will be avoided Outcome: Progressing   Problem: Activity: Goal: Risk for activity intolerance will decrease Outcome: Progressing   Problem: Nutrition: Goal: Adequate nutrition will be maintained Outcome: Progressing   Problem: Coping: Goal: Level of anxiety will decrease Outcome: Progressing   Problem: Elimination: Goal: Will not experience complications related to bowel motility Outcome: Progressing Goal: Will not experience complications related to urinary retention Outcome: Progressing   Problem: Pain Managment: Goal: General experience of comfort will improve Outcome: Progressing   Problem: Safety: Goal: Ability to remain free from injury will improve Outcome: Progressing   Problem: Skin Integrity: Goal: Risk for impaired skin integrity will decrease Outcome: Progressing

## 2020-04-15 NOTE — Progress Notes (Signed)
TAYVON CULLEY  [id number]:[id number] DOB: 1956/02/27 DOA: 04/13/2020 PCP: Patient, No Pcp Per    Brief Narrative:  64yo with a history of HTN, hypothyroidism, and gout who was admitted from med [facility] with progressively worsening COVID to the point of requiring high flow nasal cannula support.  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  He has not been vaccinated against Covid. He was diagnosed with Covid 10/2 and received a monoclonal antibody infusion 10/5. He reported 2 days of shortness of breath and chest discomfort with inhalation with subjective fevers.  He checked his oxygen saturation at home and found it to be 72 and therefore presented to the emergency room.    Significant Events:  10/2 + CoViD test 10/5 mAb infusion  10/6 [facility] 10/7 transfer to [facility]  Date of Positive COVID Test:  10/2  Vaccination Status: Unvaccinated against Covid  COVID-19 specific Treatment: Remdesivir 10/6 > Steroid 10/6 > Baricitinib 10/6 >  Antimicrobials:  Azithromycin 10/6 > Ceftriaxone 10/6 >  DVT prophylaxis: Lovenox  Subjective: Afebrile with vital signs stable.  Requiring conventional nasal cannula at 5 L and keeping saturations at 90-94%.  Inflammatory markers improving. Reports signif DOE today. No cp, n/v, or abdom pain. Spoke w/ pt using iPad interpreter, though he responded frequently w/o needing to use the interpreter.   Assessment & Plan:  COVID Pneumonia -acute hypoxic respiratory failure Continue remdesivir, steroid, and baricitinib -wean oxygen as able - appears he is stabilizing   Recent Labs  Lab 04/13/20 2220 04/13/20 2240 04/15/20 0406  DDIMER 0.65*  --  0.49  FERRITIN  --  1,341* 1,219*  CRP  --  13.6* 4.4*  ALT 33  --  29  PROCALCITON 3.61  --  1.54    Possible bacterial pneumonia Continue empiric antibiotic in setting of elevated procalcitonin  Acute kidney  injury crt 1.48 at admit - cont to follow trend - hydrate   Mild hyponatremia Corrected with volume resuscitation  HTN Blood pressure currently well controlled  Hypothyroidism Continue usual Synthroid dose  Gout Quiescent presently  Code Status: FULL CODE Family Communication:  Status is: Inpatient  Remains inpatient appropriate because:Inpatient level of care appropriate due to severity of illness   Dispo: The patient is from: Home              Anticipated d/c is to: Home              Anticipated d/c date is: 3 days              Patient currently is not medically stable to d/c.   Consultants:  none  Objective: Blood pressure 115/82, pulse 76, temperature 98.2 F (36.8 C), temperature source Oral, resp. rate 20, height 5\' 7"  (1.702 m), weight 82.4 kg, SpO2 90 %.  Intake/Output Summary (Last 24 hours) at 04/15/2020 1013 Last data filed at 04/15/2020 0900 Gross per 24 hour  Intake 600 ml  Output 1400 ml  Net -800 ml   Filed Weights   04/13/20 2204 04/14/20 0247  Weight: 85 kg 82.4 kg    Examination: General: No acute respiratory distress at rest  Lungs: fine crackles B - no wheezing  Cardiovascular: Regular rate and rhythm without murmur gallop or rub normal S1 and S2 Abdomen: Nontender, nondistended, soft, bowel sounds positive, no rebound, no ascites, no appreciable mass Extremities: No significant cyanosis, clubbing, or edema bilateral  lower extremities    CBC: Recent Labs  Lab 04/13/20 2220 04/14/20 0659 04/15/20 0406  WBC 14.5* 10.3 9.7  NEUTROABS 12.4*  --  8.0*  HGB 14.1 13.0 13.1  HCT 40.8 38.5* 38.4*  MCV 86.3 87.5 88.7  PLT 213 203 252   Basic Metabolic Panel: Recent Labs  Lab 04/13/20 2220 04/14/20 0659 04/15/20 0406  NA 130*  --  139  K 4.4  --  4.8  CL 100  --  107  CO2 19*  --  22  GLUCOSE 162*  --  178*  BUN 28*  --  29*  CREATININE 1.48* 1.47* 1.54*  CALCIUM 8.3*  --  8.5*   GFR: Estimated Creatinine Clearance: 50.4  mL/min (A) (by C-G formula based on SCr of 1.54 mg/dL (H)).  Liver Function Tests: Recent Labs  Lab 04/13/20 2220 04/15/20 0406  AST 45* 29  ALT 33 29  ALKPHOS 37* 36*  BILITOT 0.5 0.1*  PROT 7.1 6.0*  ALBUMIN 3.2* 2.5*   No results for input(s): LIPASE, AMYLASE in the last 168 hours. No results for input(s): AMMONIA in the last 168 hours.  Coagulation Profile: No results for input(s): INR, PROTIME in the last 168 hours.  Cardiac Enzymes: No results for input(s): CKTOTAL, CKMB, CKMBINDEX, TROPONINI in the last 168 hours.  HbA1C: No results found for: HGBA1C  CBG: No results for input(s): GLUCAP in the last 168 hours.  Recent Results (from the past 240 hour(s))  Blood Culture (routine x 2)     Status: None (Preliminary result)   Collection Time: 04/13/20 10:20 PM   Specimen: BLOOD  Result Value Ref Range Status   Specimen Description   Final    BLOOD LEFT ANTECUBITAL Performed at [facility], [address] [place], [place] [address]    Special Requests   Final    BOTTLES DRAWN AEROBIC AND ANAEROBIC Blood Culture adequate volume Performed at [facility], [address], [place], [place] [address]    Culture   Final    NO GROWTH 1 DAY Performed at [facility], [address], [place], [place] [address]    Report Status PENDING  Incomplete  Blood Culture (routine x 2)     Status: None (Preliminary result)   Collection Time: 04/14/20 12:45 AM   Specimen: BLOOD LEFT WRIST  Result Value Ref Range Status   Specimen Description   Final    BLOOD LEFT WRIST Performed at [facility], [address] [place], [place] [address]    Special Requests   Final    BOTTLES DRAWN AEROBIC AND ANAEROBIC Blood Culture adequate volume Performed at [facility], [address] [place], [place] [address]    Culture   Final    NO GROWTH < 24 HOURS Performed at [facility], [address], [place], [place] [address]     Report Status PENDING  Incomplete  MRSA PCR Screening     Status: None   Collection Time: 04/14/20  3:44 AM   Specimen: Nasal Mucosa; Nasopharyngeal  Result Value Ref Range Status   MRSA by PCR NEGATIVE NEGATIVE Final    Comment:        The GeneXpert MRSA Assay (FDA approved for NASAL specimens only), is one component of a comprehensive MRSA colonization surveillance program. It is not intended to diagnose MRSA infection nor to guide or monitor treatment for MRSA infections. Performed at [facility], [address], [place], [place] [address]      Scheduled Meds:  allopurinol  300 mg Oral Daily   vitamin C  500 mg Oral Daily   [START ON 04/16/2020] azithromycin  500 mg Oral Daily   baricitinib  2 mg Oral Daily   enoxaparin (LOVENOX) injection  40 mg Subcutaneous Q24H   levothyroxine  137 mcg Oral Q0600   methylPREDNISolone (SOLU-MEDROL) injection  80 mg Intravenous Q12H   zinc sulfate  220 mg Oral Daily   Continuous Infusions:  cefTRIAXone (ROCEPHIN)  IV 2 g (04/15/20 0137)   remdesivir 100 mg in NS 100 mL 100 mg (04/15/20 0941)     LOS: 1 day   Lonia Blood, MD [facility] Office  [phone] Pager - Text Page per Loretha Stapler  If 7PM-7AM, please contact night-coverage per Amion 04/15/2020, 10:13 AM

## 2020-04-16 DIAGNOSIS — U071 COVID-19: Secondary | ICD-10-CM | POA: Diagnosis not present

## 2020-04-16 LAB — COMPREHENSIVE METABOLIC PANEL
ALT: 46 U/L — ABNORMAL HIGH (ref 0–44)
AST: 33 U/L (ref 15–41)
Albumin: 2.5 g/dL — ABNORMAL LOW (ref 3.5–5.0)
Alkaline Phosphatase: 41 U/L (ref 38–126)
Anion gap: 11 (ref 5–15)
BUN: 34 mg/dL — ABNORMAL HIGH (ref 8–23)
CO2: 21 mmol/L — ABNORMAL LOW (ref 22–32)
Calcium: 8.5 mg/dL — ABNORMAL LOW (ref 8.9–10.3)
Chloride: 107 mmol/L (ref 98–111)
Creatinine, Ser: 1.69 mg/dL — ABNORMAL HIGH (ref 0.61–1.24)
GFR, Estimated: 42 mL/min — ABNORMAL LOW (ref >60)
Glucose, Bld: 206 mg/dL — ABNORMAL HIGH (ref 70–99)
Potassium: 4.9 mmol/L (ref 3.5–5.1)
Sodium: 139 mmol/L (ref 135–145)
Total Bilirubin: 0.3 mg/dL (ref 0.3–1.2)
Total Protein: 6.1 g/dL — ABNORMAL LOW (ref 6.5–8.1)

## 2020-04-16 LAB — CBC WITH DIFFERENTIAL/PLATELET
Abs Immature Granulocytes: 0.51 10*3/uL — ABNORMAL HIGH (ref 0.00–0.07)
Basophils Absolute: 0 10*3/uL (ref 0.0–0.1)
Basophils Relative: 0 %
Eosinophils Absolute: 0 10*3/uL (ref 0.0–0.5)
Eosinophils Relative: 0 %
HCT: 38.7 % — ABNORMAL LOW (ref 39.0–52.0)
Hemoglobin: 12.8 g/dL — ABNORMAL LOW (ref 13.0–17.0)
Immature Granulocytes: 4 %
Lymphocytes Relative: 8 %
Lymphs Abs: 0.9 10*3/uL (ref 0.7–4.0)
MCH: 29.4 pg (ref 26.0–34.0)
MCHC: 33.1 g/dL (ref 30.0–36.0)
MCV: 88.8 fL (ref 80.0–100.0)
Monocytes Absolute: 0.7 10*3/uL (ref 0.1–1.0)
Monocytes Relative: 5 %
Neutro Abs: 10.1 10*3/uL — ABNORMAL HIGH (ref 1.7–7.7)
Neutrophils Relative %: 83 %
Platelets: 307 10*3/uL (ref 150–400)
RBC: 4.36 MIL/uL (ref 4.22–5.81)
RDW: 13.1 % (ref 11.5–15.5)
WBC: 12.2 10*3/uL — ABNORMAL HIGH (ref 4.0–10.5)
nRBC: 0 % (ref 0.0–0.2)

## 2020-04-16 LAB — FERRITIN: Ferritin: 1116 ng/mL — ABNORMAL HIGH (ref 24–336)

## 2020-04-16 LAB — D-DIMER, QUANTITATIVE: D-Dimer, Quant: 0.43 ug/mL-FEU (ref 0.00–0.50)

## 2020-04-16 LAB — C-REACTIVE PROTEIN: CRP: 1.6 mg/dL — ABNORMAL HIGH (ref <1.0)

## 2020-04-16 MED ORDER — GUAIFENESIN ER 600 MG PO TB12
1200.0000 mg | ORAL_TABLET | Freq: Two times a day (BID) | ORAL | Status: DC
  Administered 2020-04-16 – 2020-04-19 (×7): 1200 mg via ORAL
  Filled 2020-04-16 (×7): qty 2

## 2020-04-16 NOTE — Progress Notes (Signed)
Edwin Schneider  VQM:[id number] DOB: 1955-11-26 DOA: 04/13/2020 PCP: Patient, No Pcp Per    Brief Narrative:  63yo with a history of HTN, hypothyroidism, and gout who was admitted from med [facility] with progressively worsening COVID to the point of requiring high flow nasal cannula support.  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  He has not been vaccinated against Covid. He was diagnosed with Covid 10/2 and received a monoclonal antibody infusion 10/5. He reported 2 days of shortness of breath and chest discomfort with inhalation with subjective fevers.  He checked his oxygen saturation at home and found it to be 72 and therefore presented to the emergency room.    Significant Events:  10/2 + CoViD test 10/5 mAb infusion  10/6 [facility] 10/7 transfer to [facility]  Date of Positive COVID Test:  10/2  Vaccination Status: Unvaccinated against Covid  COVID-19 specific Treatment: Remdesivir 10/6 > Steroid 10/6 > Baricitinib 10/6 >  Antimicrobials:  Azithromycin 10/6 > Ceftriaxone 10/6 >  DVT prophylaxis: Lovenox  Subjective: Afebrile.  Oxygen saturations in the low to mid 90s.  Inflammatory markers trending downward.  Complains of significant upper respiratory secretions.  Denies chest pain nausea or vomiting.  Is dyspneic on exertion.  I spoke to the patient using the iPad interpreter service.  Assessment & Plan:  COVID Pneumonia -acute hypoxic respiratory failure Continue remdesivir, steroid, and baricitinib - wean oxygen as able - appears he is stabilizing -encouraged frequent use of incentive spirometer and flutter valve  Recent Labs  Lab 04/13/20 2220 04/13/20 2240 04/15/20 0406 04/16/20 0439  DDIMER 0.65*  --  0.49 0.43  FERRITIN  --  1,341* 1,219* 1,116*  CRP  --  13.6* 4.4* 1.6*  ALT 33  --  29 46*  PROCALCITON 3.61  --  1.54  --     Possible bacterial  pneumonia Continue empiric antibiotic in setting of elevated procalcitonin -plan to discontinue antibiotics after 5-day treatment course  Acute kidney injury versus undiagnosed CKD crt 1.48 at admit -creatinine presently trending upward slightly -assure patient is hydrated and follow trend - no clear recent crt baseline - perhaps he has a chronic undiagnosed CKD  Recent Labs  Lab 04/13/20 2220 04/14/20 0659 04/15/20 0406 04/16/20 0439  CREATININE 1.48* 1.47* 1.54* 1.69*    Mild hyponatremia Corrected with volume resuscitation  HTN Blood pressure currently well controlled  Hypothyroidism Continue usual Synthroid dose  Gout Quiescent presently  Code Status: FULL CODE Family Communication:  Status is: Inpatient  Remains inpatient appropriate because:Inpatient level of care appropriate due to severity of illness   Dispo: The patient is from: Home              Anticipated d/c is to: Home              Anticipated d/c date is: 3 days              Patient currently is not medically stable to d/c.   Consultants:  none  Objective: Blood pressure 122/85, pulse 74, temperature 97.8 F (36.6 C), temperature source Oral, resp. rate 20, height 5\' 7"  (1.702 m), weight 82.4 kg, SpO2 96 %.  Intake/Output Summary (Last 24 hours) at 04/16/2020 1012 Last data filed at 04/16/2020 0943 Gross per 24 hour  Intake 240 ml  Output 200 ml  Net 40 ml   Filed Weights   04/13/20 2204  04/14/20 0247  Weight: 85 kg 82.4 kg    Examination: General: No acute respiratory distress  Lungs: fine crackles B - no wheezing - good air movement th/o  Cardiovascular: Regular rate and rhythm without murmur  Abdomen: NT/ND, soft, BS+, no mass  Extremities: No C/C/E B LE     CBC: Recent Labs  Lab 04/13/20 2220 04/13/20 2220 04/14/20 0659 04/15/20 0406 04/16/20 0439  WBC 14.5*   < > 10.3 9.7 12.2*  NEUTROABS 12.4*  --   --  8.0* 10.1*  HGB 14.1   < > 13.0 13.1 12.8*  HCT 40.8   < > 38.5*  38.4* 38.7*  MCV 86.3   < > 87.5 88.7 88.8  PLT 213   < > 203 252 307   < > = values in this interval not displayed.   Basic Metabolic Panel: Recent Labs  Lab 04/13/20 2220 04/13/20 2220 04/14/20 0659 04/15/20 0406 04/16/20 0439  NA 130*  --   --  139 139  K 4.4  --   --  4.8 4.9  CL 100  --   --  107 107  CO2 19*  --   --  22 21*  GLUCOSE 162*  --   --  178* 206*  BUN 28*  --   --  29* 34*  CREATININE 1.48*   < > 1.47* 1.54* 1.69*  CALCIUM 8.3*  --   --  8.5* 8.5*   < > = values in this interval not displayed.   GFR: Estimated Creatinine Clearance: 45.9 mL/min (A) (by C-G formula based on SCr of 1.69 mg/dL (H)).  Liver Function Tests: Recent Labs  Lab 04/13/20 2220 04/15/20 0406 04/16/20 0439  AST 45* 29 33  ALT 33 29 46*  ALKPHOS 37* 36* 41  BILITOT 0.5 0.1* 0.3  PROT 7.1 6.0* 6.1*  ALBUMIN 3.2* 2.5* 2.5*     Recent Results (from the past 240 hour(s))  Blood Culture (routine x 2)     Status: None (Preliminary result)   Collection Time: 04/13/20 10:20 PM   Specimen: BLOOD  Result Value Ref Range Status   Specimen Description   Final    BLOOD LEFT ANTECUBITAL Performed at [facility], [address] [place], [place] [address]    Special Requests   Final    BOTTLES DRAWN AEROBIC AND ANAEROBIC Blood Culture adequate volume Performed at [facility], [address], [place], [place] [address]    Culture   Final    NO GROWTH 2 DAYS Performed at [facility], [address], [place], [place] [address]    Report Status PENDING  Incomplete  Blood Culture (routine x 2)     Status: None (Preliminary result)   Collection Time: 04/14/20 12:45 AM   Specimen: BLOOD LEFT WRIST  Result Value Ref Range Status   Specimen Description   Final    BLOOD LEFT WRIST Performed at [facility], [address] [place], [place] [address]    Special Requests   Final    BOTTLES DRAWN AEROBIC AND ANAEROBIC Blood Culture adequate  volume Performed at [facility], [address] [place], [place] [address]    Culture   Final    NO GROWTH 2 DAYS Performed at [facility], [address], [place], [place] [address]    Report Status PENDING  Incomplete  MRSA PCR Screening     Status:  None   Collection Time: 04/14/20  3:44 AM   Specimen: Nasal Mucosa; Nasopharyngeal  Result Value Ref Range Status   MRSA by PCR NEGATIVE NEGATIVE Final    Comment:        The GeneXpert MRSA Assay (FDA approved for NASAL specimens only), is one component of a comprehensive MRSA colonization surveillance program. It is not intended to diagnose MRSA infection nor to guide or monitor treatment for MRSA infections. Performed at [facility], [address] [place], [place] [address]      Scheduled Meds: . allopurinol  300 mg Oral Daily  . vitamin C  500 mg Oral Daily  . azithromycin  500 mg Oral Daily  . baricitinib  2 mg Oral Daily  . benzonatate  200 mg Oral TID  . enoxaparin (LOVENOX) injection  40 mg Subcutaneous Q24H  . levothyroxine  137 mcg Oral Q0600  . methylPREDNISolone (SOLU-MEDROL) injection  80 mg Intravenous Q12H  . zinc sulfate  220 mg Oral Daily   Continuous Infusions: . cefTRIAXone (ROCEPHIN)  IV 2 g (04/16/20 0050)  . remdesivir 100 mg in NS 100 mL 100 mg (04/16/20 0916)     LOS: 2 days   Lonia Blood, MD [facility] Office  [phone] Pager - Text Page per Amion  If 7PM-7AM, please contact night-coverage per Amion 04/16/2020, 10:12 AM

## 2020-04-16 NOTE — Evaluation (Signed)
Physical Therapy Evaluation Patient Details Name: Edwin Schneider MRN: [id number] DOB: 06-20-1956 Today's Date: 04/16/2020   History of Present Illness  64 y.o. male admitted on 04/13/20 for + COVID 19 (tested + on 10/2) with worsening SOB requiring hospitalization.  Pt dx with COVID PNA, acute hypoxic respiratory failure, AKI, mild hyponatremia. Pt with significant PMH of gout, thyroid disease, ORIF pelvic fx and knee surgery.   Clinical Impression  Pt is quite mobile with only mild balance deficits, however, his respiratory tolerance of moving is poor. On 10 L O2 [place] he dropped to 71% with 3/4 DOE with only walking to the bathroom.  He does have episodes of coughing where he dry heaves reporting he is "choking on my phlegm".  At rest he is anywhere from 87-92% on 9 L O2 [place].  We reviewed the incentive spirometer and the flutter valve as well as beginner energy conservation education.   PT to follow acutely for deficits listed below.      Follow Up Recommendations Home health PT    Equipment Recommendations  Other (comment) (home O2 likely)    Recommendations for Other Services       Precautions / Restrictions Precautions Precautions: Other (comment) Precaution Comments: high levels of O2      Mobility  Bed Mobility Overal bed mobility: Modified Independent                Transfers Overall transfer level: Needs assistance   Transfers: Sit to/from Stand Sit to Stand: Supervision         General transfer comment: supervision for safety, fast to move, cues to slow down  Ambulation/Gait Ambulation/Gait assistance: Min guard Gait Distance (Feet): 20 Feet (x2) Assistive device: None Gait Pattern/deviations: Step-through pattern;Staggering right;Staggering left     General Gait Details: Pt with mildly staggering gait pattern, cues to slow down, stop talking while walking. O2 sats dropped as low as 71% on 10 L O2  during gait.   Stairs            Wheelchair  Mobility    Modified Rankin (Stroke Patients Only)       Balance Overall balance assessment: Mild deficits observed, not formally tested                                           Pertinent Vitals/Pain Pain Assessment: No/denies pain    Home Living Family/patient expects to be discharged to:: Private residence Living Arrangements: Spouse/significant other Available Help at Discharge: Family;Available 24 hours/day (wife is also sick, but mild symptoms, she normally works ) Type of Home: [employer]: None      Prior Function Level of Independence: Independent         Comments: works full time as an Barrister's clerk, 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      Hand Dominance   Dominant Hand: Right    Extremity/Trunk Assessment   Upper Extremity Assessment Upper Extremity Assessment: Defer to OT evaluation    Lower Extremity Assessment Lower Extremity Assessment: Overall WFL for tasks assessed    Cervical / Trunk Assessment Cervical / Trunk Assessment: Normal  Communication   Communication: Prefers language other than English (Spanish, video interpreter used throughout)  Cognition Arousal/Alertness: Awake/alert Behavior During Therapy: WFL for tasks assessed/performed Overall Cognitive Status: Impaired/Different from  baseline Area of Impairment: Memory;Problem solving                             Problem Solving: Slow processing General Comments: Pt a bit foggy with STM deficits, at times word retrieval issues, and slow processing speed.       General Comments      Exercises Other Exercises Other Exercises: issued incentive spirometer as ordered. He already had a flutter valve in his room, but he was inhaling instead of exhaling.  Reviewed both devices and pt was able to return demonstration.  He reports some rib pain with the incentive spirometer, so I limited reps in a row for it to 3 reps.   Max inspired volume is 1,000 mL.    Assessment/Plan    PT Assessment Patient needs continued PT services  PT Problem List Decreased activity tolerance;Decreased balance;Decreased cognition;Decreased knowledge of use of DME;Decreased knowledge of precautions;Cardiopulmonary status limiting activity       PT Treatment Interventions DME instruction;Gait training;Stair training;Functional mobility training;Therapeutic activities;Therapeutic exercise;Balance training;Patient/family education;Cognitive remediation    PT Goals (Current goals can be found in the Care Plan section)  Acute Rehab PT Goals Patient Stated Goal: to get better and back to his dog.  PT Goal Formulation: With patient Time For Goal Achievement: 04/30/20 Potential to Achieve Goals: Good    Frequency Min 3X/week   Barriers to discharge        Co-evaluation               AM-PAC PT "6 Clicks" Mobility  Outcome Measure Help needed turning from your back to your side while in a flat bed without using bedrails?: None Help needed moving from lying on your back to sitting on the side of a flat bed without using bedrails?: None Help needed moving to and from a bed to a chair (including a wheelchair)?: None Help needed standing up from a chair using your arms (e.g., wheelchair or bedside chair)?: None Help needed to walk in hospital room?: A Little Help needed climbing 3-5 steps with a railing? : A Little 6 Click Score: 22    End of Session Equipment Utilized During Treatment: Oxygen Activity Tolerance: Other (comment) (limited by SOB) Patient left: in chair;with call bell/phone within reach   PT Visit Diagnosis: Difficulty in walking, not elsewhere classified (R26.2)    Time: 1631-1700 PT Time Calculation (min) (ACUTE ONLY): 29 min   Charges:   PT Evaluation $PT Eval Moderate Complexity: 1 Mod PT Treatments $Gait Training: 8-22 mins       Corinna Capra, PT, DPT  Acute Rehabilitation [phone]  pager [phone]) [phone] office

## 2020-04-17 ENCOUNTER — Inpatient Hospital Stay (HOSPITAL_COMMUNITY): Payer: Medicare Other

## 2020-04-17 DIAGNOSIS — U071 COVID-19: Secondary | ICD-10-CM | POA: Diagnosis not present

## 2020-04-17 LAB — CBC WITH DIFFERENTIAL/PLATELET
Abs Immature Granulocytes: 0.86 10*3/uL — ABNORMAL HIGH (ref 0.00–0.07)
Basophils Absolute: 0.1 10*3/uL (ref 0.0–0.1)
Basophils Relative: 0 %
Eosinophils Absolute: 0 10*3/uL (ref 0.0–0.5)
Eosinophils Relative: 0 %
HCT: 40.3 % (ref 39.0–52.0)
Hemoglobin: 13.2 g/dL (ref 13.0–17.0)
Immature Granulocytes: 6 %
Lymphocytes Relative: 5 %
Lymphs Abs: 0.7 10*3/uL (ref 0.7–4.0)
MCH: 29.2 pg (ref 26.0–34.0)
MCHC: 32.8 g/dL (ref 30.0–36.0)
MCV: 89.2 fL (ref 80.0–100.0)
Monocytes Absolute: 0.8 10*3/uL (ref 0.1–1.0)
Monocytes Relative: 6 %
Neutro Abs: 11.3 10*3/uL — ABNORMAL HIGH (ref 1.7–7.7)
Neutrophils Relative %: 83 %
Platelets: 377 10*3/uL (ref 150–400)
RBC: 4.52 MIL/uL (ref 4.22–5.81)
RDW: 12.7 % (ref 11.5–15.5)
WBC: 13.7 10*3/uL — ABNORMAL HIGH (ref 4.0–10.5)
nRBC: 0 % (ref 0.0–0.2)

## 2020-04-17 LAB — COMPREHENSIVE METABOLIC PANEL
ALT: 50 U/L — ABNORMAL HIGH (ref 0–44)
AST: 26 U/L (ref 15–41)
Albumin: 2.4 g/dL — ABNORMAL LOW (ref 3.5–5.0)
Alkaline Phosphatase: 38 U/L (ref 38–126)
Anion gap: 10 (ref 5–15)
BUN: 32 mg/dL — ABNORMAL HIGH (ref 8–23)
CO2: 21 mmol/L — ABNORMAL LOW (ref 22–32)
Calcium: 8.3 mg/dL — ABNORMAL LOW (ref 8.9–10.3)
Chloride: 105 mmol/L (ref 98–111)
Creatinine, Ser: 1.47 mg/dL — ABNORMAL HIGH (ref 0.61–1.24)
GFR, Estimated: 50 mL/min — ABNORMAL LOW (ref >60)
Glucose, Bld: 229 mg/dL — ABNORMAL HIGH (ref 70–99)
Potassium: 5 mmol/L (ref 3.5–5.1)
Sodium: 136 mmol/L (ref 135–145)
Total Bilirubin: 0.2 mg/dL — ABNORMAL LOW (ref 0.3–1.2)
Total Protein: 6 g/dL — ABNORMAL LOW (ref 6.5–8.1)

## 2020-04-17 LAB — FERRITIN: Ferritin: 984 ng/mL — ABNORMAL HIGH (ref 24–336)

## 2020-04-17 LAB — D-DIMER, QUANTITATIVE: D-Dimer, Quant: 0.59 ug/mL-FEU — ABNORMAL HIGH (ref 0.00–0.50)

## 2020-04-17 LAB — C-REACTIVE PROTEIN: CRP: 0.8 mg/dL (ref <1.0)

## 2020-04-17 MED ORDER — ORAL CARE MOUTH RINSE
15.0000 mL | Freq: Two times a day (BID) | OROMUCOSAL | Status: DC
  Administered 2020-04-17 – 2020-04-19 (×4): 15 mL via OROMUCOSAL

## 2020-04-17 MED ORDER — SODIUM CHLORIDE 0.9 % IV SOLN
INTRAVENOUS | Status: DC | PRN
  Administered 2020-04-17: 20 mL via INTRAVENOUS

## 2020-04-17 NOTE — Evaluation (Signed)
Occupational Therapy Evaluation Patient Details Name: Edwin Schneider MRN: [id number] DOB: [birth year] Today's Date: 04/17/2020    History of Present Illness 64 y.o. male admitted on 04/13/20 for + COVID 19 (tested + on 10/2) with worsening SOB requiring hospitalization.  Pt dx with COVID PNA, acute hypoxic respiratory failure, AKI, mild hyponatremia. Pt with significant PMH of gout, thyroid disease, ORIF pelvic fx and knee surgery.    Clinical Impression   This 64 y/o male presents with the above. PTA pt reports independence with ADL, iADL and functional mobility. Pt currently presenting with notable weakness, fatigue and deconditioning all impacting his functional performance. Pt performing sit<>stand transfers without AD and maintaining static balance with minguard assist; he was unable to tolerate further distance mobility due to fatigue. Pt requiring up to modA for LB ADL, minA for seated UB ADL. Pt on 9L HFNC during session with lowest SpO2 noted 83% with activity. Pt to benefit from continued acute OT services and currently recommend follow up [facility] services after discharge to maximize his overall safety and independence with ADL and mobility.     Follow Up Recommendations  Home health OT;Supervision/Assistance - 24 hour    Equipment Recommendations  Tub/shower seat           Precautions / Restrictions Precautions Precautions: Other (comment) Precaution Comments: high levels of O2 Restrictions Weight Bearing Restrictions: No      Mobility Bed Mobility               General bed mobility comments: OOB in recliner  Transfers Overall transfer level: Needs assistance   Transfers: Sit to/from Stand Sit to Stand: Supervision         General transfer comment: supervision for safety and balance    Balance Overall balance assessment: Mild deficits observed, not formally tested                                         ADL either performed or  assessed with clinical judgement   ADL Overall ADL's : Needs assistance/impaired Eating/Feeding: Modified independent;Sitting   Grooming: Set up;Sitting   Upper Body Bathing: Minimal assistance;Sitting   Lower Body Bathing: Moderate assistance;Sit to/from stand   Upper Body Dressing : Minimal assistance;Sitting   Lower Body Dressing: Moderate assistance;Sit to/from stand Lower Body Dressing Details (indicate cue type and reason): pt required assist to don his socks seated in recliner, reports too fatigued to attempt to perform on his own              Functional mobility during ADLs: Min guard General ADL Comments: pt with notable weakness and deconditioning, tolerating sit<>stand and static standing, seated exercise. too fatigued to attempt further mobility. educated on importance of movement and encouraged seated HEP to increase strength/endurance                          Pertinent Vitals/Pain Pain Assessment: Faces Faces Pain Scale: Hurts even more Pain Location: back/hip (reports at site of previous pelvic sx) Pain Descriptors / Indicators: Discomfort;Grimacing;Guarding Pain Intervention(s): Limited activity within patient's tolerance;Monitored during session;Repositioned     Hand Dominance Right   Extremity/Trunk Assessment Upper Extremity Assessment Upper Extremity Assessment: Generalized weakness   Lower Extremity Assessment Lower Extremity Assessment: Defer to PT evaluation       Communication Communication Communication: Prefers language other than [place] (Spanish video interpreter used,  Edwin Schneider [phone])   Cognition Arousal/Alertness: Awake/alert Behavior During Therapy: WFL for tasks assessed/performed Overall Cognitive Status: Impaired/Different from baseline Area of Impairment: Memory;Problem solving                             Problem Solving: Slow processing General Comments: noted slower processing/difficulty understanding  questions/instructions   General Comments       Exercises Exercises: Other exercises;General Lower Extremity General Exercises - Lower Extremity Long Arc Quad: AROM;Both;10 reps;Seated Hip Flexion/Marching: AROM;Both;10 reps;Seated Other Exercises Other Exercises: flutter valve x10 with rest breaks in between Other Exercises: encouraged continued use of IS - pt reports has been performing every hour   Shoulder Instructions      Home Living Family/patient expects to be discharged to:: Private residence Living Arrangements: Spouse/significant other Available Help at Discharge: Family;Available 24 hours/day (wife is sick but mild symptoms, works) Type of Home: [employer]: None          Prior Functioning/Environment Level of Independence: Independent        Comments: works full time as an Barrister's clerk, 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         OT Problem List: Decreased strength;Decreased activity tolerance;Impaired balance (sitting and/or standing);Cardiopulmonary status limiting activity;Pain;Decreased knowledge of use of DME or AE      OT Treatment/Interventions: Self-care/ADL training;Therapeutic exercise;Energy conservation;DME and/or AE instruction;Therapeutic activities;Patient/family education;Balance training    OT Goals(Current goals can be found in the care plan section) Acute Rehab OT Goals Patient Stated Goal: to get better and back to his dog.  OT Goal Formulation: With patient Time For Goal Achievement: 05/01/20 Potential to Achieve Goals: Good  OT Frequency: Min 2X/week   Barriers to D/C:            Co-evaluation              AM-PAC OT "6 Clicks" Daily Activity     Outcome Measure Help from another person eating meals?: None Help from another person taking care of personal grooming?: A Little Help from another person toileting, which includes using toliet, bedpan, or urinal?:  A Lot Help from another person bathing (including washing, rinsing, drying)?: A Lot Help from another person to put on and taking off regular upper body clothing?: A Little Help from another person to put on and taking off regular lower body clothing?: A Lot 6 Click Score: 16   End of Session Equipment Utilized During Treatment: Oxygen Nurse Communication: Mobility status  Activity Tolerance: Patient limited by fatigue Patient left: in chair;with call bell/phone within reach  OT Visit Diagnosis: Muscle weakness (generalized) (M62.81);Other abnormalities of gait and mobility (R26.89);Other (comment) (decreased activity tolerance )                Time: 1338-1410 OT Time Calculation (min): 32 min Charges:  OT General Charges $OT Visit: 1 Visit OT Evaluation $OT Eval Moderate Complexity: 1 Mod OT Treatments $Self Care/Home Management : 8-22 mins  Marcy Siren, OT Acute Rehabilitation Services Pager [phone] Office [phone]   Orlando Penner 04/17/2020, 3:09 PM

## 2020-04-17 NOTE — Plan of Care (Signed)

## 2020-04-17 NOTE — Progress Notes (Signed)
   04/16/20 2038  Assess: MEWS Score  Temp 98 F (36.7 C)  BP 99/75  Pulse Rate 68  Resp 20 (RR reassessed)  Level of Consciousness Alert  SpO2 (!) 89 %  O2 Device HFNC  Patient Activity (if Appropriate) In bed  O2 Flow Rate (L/min) 9 L/min  Assess: MEWS Score  MEWS Temp 0  MEWS Systolic 1  MEWS Pulse 0  MEWS RR 0  MEWS LOC 0  MEWS Score 1  MEWS Score Color Green  Assess: if the MEWS score is Yellow or Red  Were vital signs taken at a resting state? Yes  Focused Assessment No change from prior assessment  Early Detection of Sepsis Score *See Row Information* Low  MEWS guidelines implemented *See Row Information* No, other (Comment) (no acute changes)  Treat  Pain Scale 0-10  Pain Score 0  Document  Progress note created (see row info) Yes

## 2020-04-17 NOTE — Progress Notes (Signed)
Edwin Schneider  NTI:[id number] DOB: May 15, 1956 DOA: 04/13/2020 PCP: Patient, No Pcp Per    Brief Narrative:  63yo with a history of HTN, hypothyroidism, and gout who was admitted from med [facility] with progressively worsening COVID to the point of requiring high flow nasal cannula support.  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  He has not been vaccinated against Covid. He was diagnosed with Covid 10/2 and received a monoclonal antibody infusion 10/5. He reported 2 days of shortness of breath and chest discomfort with inhalation with subjective fevers.  He checked his oxygen saturation at home and found it to be 72 and therefore presented to the emergency room.    Significant Events:  10/2 + CoViD test 10/5 mAb infusion  10/6 admit via [facility] 10/7 transfer to [facility]  Date of Positive COVID Test:  10/2  Vaccination Status: Unvaccinated against Covid  COVID-19 specific Treatment: Remdesivir 10/6 > 10/10 Steroid 10/6 > Baricitinib 10/6 >  Antimicrobials:  Azithromycin 10/6 > Ceftriaxone 10/6 >  DVT prophylaxis: Lovenox  Subjective: Patient desaturated to 71% despite 10 L nasal cannula support when working with physical therapy yesterday.  He was able to quickly recover to 87% or greater on 9 L when not exerting himself.  Afebrile.  Vital signs stable otherwise.  Continues to require 9 L nasal cannula support when at rest.   Assessment & Plan:  COVID Pneumonia -acute hypoxic respiratory failure Continue steroid, and baricitinib -completes his fifth dose of remdesivir today - wean oxygen as able - encouraged frequent use of incentive spirometer and flutter valve -desaturates profoundly with exertion therefore hospitalization likely to be prolonged  Recent Labs  Lab 04/13/20 2220 04/13/20 2240 04/15/20 0406 04/16/20 0439 04/17/20 0702  DDIMER 0.65*  --  0.49 0.43 0.59*  FERRITIN  --   1,341* 1,219* 1,116* 984*  CRP  --  13.6* 4.4* 1.6* 0.8  ALT 33  --  29 46* 50*  PROCALCITON 3.61  --  1.54  --   --     Possible bacterial pneumonia Continue empiric antibiotic in setting of elevated procalcitonin -plan to discontinue antibiotics after 5-day treatment course  Acute kidney injury versus undiagnosed CKD crt 1.48 at admit -creatinine presently trending upward slightly -assure patient is hydrated and follow trend - no clear recent crt baseline - perhaps he has a chronic undiagnosed CKD  Recent Labs  Lab 04/13/20 2220 04/14/20 0659 04/15/20 0406 04/16/20 0439 04/17/20 0702  CREATININE 1.48* 1.47* 1.54* 1.69* 1.47*    Mild hyponatremia Corrected with volume resuscitation  HTN Blood pressure currently well controlled  Hypothyroidism Continue usual Synthroid dose  Gout Quiescent presently  Code Status: FULL CODE Family Communication:  Status is: Inpatient  Remains inpatient appropriate because:Inpatient level of care appropriate due to severity of illness   Dispo: The patient is from: Home              Anticipated d/c is to: Home              Anticipated d/c date is: 3 days              Patient currently is not medically stable to d/c.   Consultants:  none  Objective: Blood pressure (!) 134/93, pulse 67, temperature 98.2 F (36.8 C), temperature source Oral, resp. rate 16, height 5\' 7"  (1.702 m), weight 82.4 kg, SpO2 93 %.  Intake/Output Summary (Last  24 hours) at 04/17/2020 1008 Last data filed at 04/17/2020 0900 Gross per 24 hour  Intake 222.68 ml  Output 725 ml  Net -502.32 ml   Filed Weights   04/13/20 2204 04/14/20 0247  Weight: 85 kg 82.4 kg    Examination: General: No acute respiratory distress  Lungs: fine crackles B - no wheezing  Cardiovascular: Regular rate and rhythm without murmur  Abdomen: NT/ND, soft, BS+, no mass  Extremities: No C/C/E B lower extremities     CBC: Recent Labs  Lab 04/15/20 0406 04/16/20 0439  04/17/20 0702  WBC 9.7 12.2* 13.7*  NEUTROABS 8.0* 10.1* 11.3*  HGB 13.1 12.8* 13.2  HCT 38.4* 38.7* 40.3  MCV 88.7 88.8 89.2  PLT 252 307 377   Basic Metabolic Panel: Recent Labs  Lab 04/15/20 0406 04/16/20 0439 04/17/20 0702  NA 139 139 136  K 4.8 4.9 5.0  CL 107 107 105  CO2 22 21* 21*  GLUCOSE 178* 206* 229*  BUN 29* 34* 32*  CREATININE 1.54* 1.69* 1.47*  CALCIUM 8.5* 8.5* 8.3*   GFR: Estimated Creatinine Clearance: 52.8 mL/min (A) (by C-G formula based on SCr of 1.47 mg/dL (H)).  Liver Function Tests: Recent Labs  Lab 04/13/20 2220 04/15/20 0406 04/16/20 0439 04/17/20 0702  AST 45* 29 33 26  ALT 33 29 46* 50*  ALKPHOS 37* 36* 41 38  BILITOT 0.5 0.1* 0.3 0.2*  PROT 7.1 6.0* 6.1* 6.0*  ALBUMIN 3.2* 2.5* 2.5* 2.4*     Recent Results (from the past 240 hour(s))  Blood Culture (routine x 2)     Status: None (Preliminary result)   Collection Time: 04/13/20 10:20 PM   Specimen: BLOOD  Result Value Ref Range Status   Specimen Description   Final    BLOOD LEFT ANTECUBITAL Performed at [facility], [address] [place], [place] [address]    Special Requests   Final    BOTTLES DRAWN AEROBIC AND ANAEROBIC Blood Culture adequate volume Performed at [facility], [address] [place], [place] [address]    Culture   Final    NO GROWTH 2 DAYS Performed at [facility], [address], [place], [place] [address]    Report Status PENDING  Incomplete  Blood Culture (routine x 2)     Status: None (Preliminary result)   Collection Time: 04/14/20 12:45 AM   Specimen: BLOOD LEFT WRIST  Result Value Ref Range Status   Specimen Description   Final    BLOOD LEFT WRIST Performed at [facility], [address] [place], [place] [address]    Special Requests   Final    BOTTLES DRAWN AEROBIC AND ANAEROBIC Blood Culture adequate volume Performed at [facility], [address] [place], [place] [address]     Culture   Final    NO GROWTH 2 DAYS Performed at [facility], [address], [place], [place] [address]    Report Status PENDING  Incomplete  MRSA PCR Screening     Status: None   Collection Time: 04/14/20  3:44 AM   Specimen: Nasal Mucosa; Nasopharyngeal  Result Value Ref Range Status   MRSA by PCR NEGATIVE NEGATIVE Final    Comment:        The GeneXpert MRSA Assay (FDA approved for NASAL specimens only), is one component of a comprehensive MRSA colonization surveillance program. It is not intended to diagnose MRSA infection nor  to guide or monitor treatment for MRSA infections. Performed at [facility], [address] [place], [place] [address]      Scheduled Meds:  allopurinol  300 mg Oral Daily   vitamin C  500 mg Oral Daily   azithromycin  500 mg Oral Daily   baricitinib  2 mg Oral Daily   enoxaparin (LOVENOX) injection  40 mg Subcutaneous Q24H   guaiFENesin  1,200 mg Oral BID   levothyroxine  137 mcg Oral Q0600   mouth rinse  15 mL Mouth Rinse BID   methylPREDNISolone (SOLU-MEDROL) injection  80 mg Intravenous Q12H   zinc sulfate  220 mg Oral Daily   Continuous Infusions:  sodium chloride Stopped (04/17/20 0432)   cefTRIAXone (ROCEPHIN)  IV Stopped (04/17/20 0253)   remdesivir 100 mg in NS 100 mL 100 mg (04/16/20 0916)     LOS: 3 days   Lonia Blood, MD [facility] Office  [phone] Pager - Text Page per Loretha Stapler  If 7PM-7AM, please contact night-coverage per Amion 04/17/2020, 10:08 AM

## 2020-04-18 DIAGNOSIS — U071 COVID-19: Secondary | ICD-10-CM | POA: Diagnosis not present

## 2020-04-18 LAB — CBC WITH DIFFERENTIAL/PLATELET
Abs Immature Granulocytes: 0.1 10*3/uL — ABNORMAL HIGH (ref 0.00–0.07)
Basophils Absolute: 0.1 10*3/uL (ref 0.0–0.1)
Basophils Relative: 1 %
Eosinophils Absolute: 0 10*3/uL (ref 0.0–0.5)
Eosinophils Relative: 0 %
HCT: 39.3 % (ref 39.0–52.0)
Hemoglobin: 13.4 g/dL (ref 13.0–17.0)
Lymphocytes Relative: 5 %
Lymphs Abs: 0.7 10*3/uL (ref 0.7–4.0)
MCH: 29.8 pg (ref 26.0–34.0)
MCHC: 34.1 g/dL (ref 30.0–36.0)
MCV: 87.3 fL (ref 80.0–100.0)
Monocytes Absolute: 0.6 10*3/uL (ref 0.1–1.0)
Monocytes Relative: 4 %
Myelocytes: 1 %
Neutro Abs: 13.3 10*3/uL — ABNORMAL HIGH (ref 1.7–7.7)
Neutrophils Relative %: 89 %
Platelets: 439 10*3/uL — ABNORMAL HIGH (ref 150–400)
RBC: 4.5 MIL/uL (ref 4.22–5.81)
RDW: 12.5 % (ref 11.5–15.5)
WBC: 14.9 10*3/uL — ABNORMAL HIGH (ref 4.0–10.5)
nRBC: 0 /100 WBC
nRBC: 0.3 % — ABNORMAL HIGH (ref 0.0–0.2)

## 2020-04-18 LAB — COMPREHENSIVE METABOLIC PANEL
ALT: 42 U/L (ref 0–44)
AST: 16 U/L (ref 15–41)
Albumin: 2.3 g/dL — ABNORMAL LOW (ref 3.5–5.0)
Alkaline Phosphatase: 42 U/L (ref 38–126)
Anion gap: 11 (ref 5–15)
BUN: 36 mg/dL — ABNORMAL HIGH (ref 8–23)
CO2: 21 mmol/L — ABNORMAL LOW (ref 22–32)
Calcium: 8.5 mg/dL — ABNORMAL LOW (ref 8.9–10.3)
Chloride: 105 mmol/L (ref 98–111)
Creatinine, Ser: 1.59 mg/dL — ABNORMAL HIGH (ref 0.61–1.24)
GFR, Estimated: 46 mL/min — ABNORMAL LOW (ref >60)
Glucose, Bld: 261 mg/dL — ABNORMAL HIGH (ref 70–99)
Potassium: 5.1 mmol/L (ref 3.5–5.1)
Sodium: 137 mmol/L (ref 135–145)
Total Bilirubin: 0.3 mg/dL (ref 0.3–1.2)
Total Protein: 5.9 g/dL — ABNORMAL LOW (ref 6.5–8.1)

## 2020-04-18 LAB — FERRITIN: Ferritin: 1016 ng/mL — ABNORMAL HIGH (ref 24–336)

## 2020-04-18 LAB — D-DIMER, QUANTITATIVE: D-Dimer, Quant: 0.46 ug/mL-FEU (ref 0.00–0.50)

## 2020-04-18 LAB — C-REACTIVE PROTEIN: CRP: 0.6 mg/dL (ref <1.0)

## 2020-04-18 MED ORDER — PREDNISONE 20 MG PO TABS
50.0000 mg | ORAL_TABLET | Freq: Every day | ORAL | Status: DC
  Administered 2020-04-19: 50 mg via ORAL
  Filled 2020-04-18: qty 2

## 2020-04-18 MED ORDER — MOMETASONE FURO-FORMOTEROL FUM 100-5 MCG/ACT IN AERO
2.0000 | INHALATION_SPRAY | Freq: Two times a day (BID) | RESPIRATORY_TRACT | Status: DC
  Administered 2020-04-18 – 2020-04-19 (×2): 2 via RESPIRATORY_TRACT
  Filled 2020-04-18: qty 8.8

## 2020-04-18 NOTE — TOC Initial Note (Addendum)
Transition of Care [facility]) - Initial/Assessment Note    Patient Details  Name: Edwin Schneider MRN: [id number] Date of Birth: 1956/02/11  Transition of [facility]) CM/SW Contact:    Mearl Latin, LCSW Phone Number: 04/18/2020, 4:17 PM  Clinical Narrative:                 CSW received consult for possible home health services at time of discharge. CSW spoke with patient on the phone utilizing Interpretor [phone]) regarding PT recommendation of Home Health PT at time of discharge. Patient reported that he would like home health services. Patient reports preference for a company with the best ratings CSW sent referral for review with [facility] and was accepted for RN/PT/OT. CSW provided Medicare HH ratings list. CSW discussed equipment needs with patient and he denied any needs except for home oxygen, which he does not already have at home. CSW will arrange delivery once more medically stable. CSW confirmed PCP (Dr. Edward Jolly with [facility]) and address with patient. Patient states his family can be contacted for needs but was feeling too tired to remember his wife's phone number. CSW updated patient's son as well. No further questions reported at this time. CSW to continue to follow and assist with discharge planning needs.   Expected Discharge Plan: Home w Home Health Services Barriers to Discharge: Continued Medical Work up   Patient Goals and CMS Choice Patient states their goals for this hospitalization and ongoing recovery are:: Feel better CMS Medicare.gov Compare Post Acute Care list provided to:: Patient Choice offered to / list presented to : Patient  Expected Discharge Plan and Services Expected Discharge Plan: Home w Home Health Services   Discharge Planning Services: CM Consult Post Acute Care Choice: Home Health, Durable Medical Equipment Living arrangements for the past 2 months: Single Family Home                                      Prior Living  Arrangements/Services Living arrangements for the past 2 months: Single Family Home Lives with:: Spouse Patient language and need for interpreter reviewed:: Yes Do you feel safe going back to the place where you live?: Yes      Need for Family Participation in Patient Care: Yes (Comment) Care giver support system in place?: Yes (comment)   Criminal Activity/Legal Involvement Pertinent to Current Situation/Hospitalization: No - Comment as needed  Activities of Daily Living Home Assistive Devices/Equipment: None ADL Screening (condition at time of admission) Patient's cognitive ability adequate to safely complete daily activities?: Yes Is the patient deaf or have difficulty hearing?: No Does the patient have difficulty seeing, even when wearing glasses/contacts?: No Does the patient have difficulty concentrating, remembering, or making decisions?: No Patient able to express need for assistance with ADLs?: Yes Does the patient have difficulty dressing or bathing?: No Independently performs ADLs?: Yes (appropriate for developmental age) Does the patient have difficulty walking or climbing stairs?: No Weakness of Legs: None Weakness of Arms/Hands: None  Permission Sought/Granted Permission sought to share information with : Facility Medical sales representative, Family Supports Permission granted to share information with : Yes, Verbal Permission Granted  Share Information with NAME: Onalee Hua  Permission granted to share info w AGENCY: HH  Permission granted to share info w Relationship: Son, spouse  Permission granted to share info w Contact Information: [phone]  Emotional Assessment   Attitude/Demeanor/Rapport: Gracious Affect (typically observed): Accepting, Appropriate  Orientation: : Oriented to Self, Oriented to Place, Oriented to  Time, Oriented to Situation Alcohol / Substance Use: Not Applicable Psych Involvement: No (comment)  Admission diagnosis:  COVID-19 virus infection  [U07.1] Patient Active Problem List   Diagnosis Date Noted  . COVID-19 virus infection 04/13/2020  . Fall 02/16/2014  . Concussion 02/16/2014  . Ileus, postoperative (HCC) 02/16/2014  . Multiple pelvic fractures (HCC) 02/11/2014  . HYPERLIPIDEMIA 02/27/2010  . LEG PAIN, BILATERAL 02/27/2010  . Anxiety state 01/26/2010  . ABDOMINAL PAIN, EPIGASTRIC 05/13/2009  . GERD 02/28/2009  . ALLERGIC RHINITIS 09/27/2008  . DIZZINESS 09/27/2008  . Lateral epicondylitis  of elbow 06/09/2008  . UTI 09/10/2007  . ABDOMINAL PAIN, LEFT UPPER QUADRANT 09/10/2007  . Hypothyroidism 04/03/2007  . GOUT NOS 01/31/2007   PCP:  Patient, No Pcp Per Pharmacy:   [id number] [place], [place] - [address] [place] [address] Phone: [phone] Fax: [phone]     Social Determinants of Health (SDOH) Interventions    Readmission Risk Interventions No flowsheet data found.

## 2020-04-18 NOTE — Progress Notes (Signed)
Edwin Schneider  OYD:[id number] DOB: 26-Oct-1955 DOA: 04/13/2020 PCP: Patient, No Pcp Per    Brief Narrative:  63yo with a history of HTN, hypothyroidism, and gout who was admitted from [facility] with progressively worsening COVID to the point of requiring high flow nasal cannula support.  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  He has not been vaccinated against Covid. He was diagnosed with Covid 10/2 and received a monoclonal antibody infusion 10/5. He reported 2 days of shortness of breath and chest discomfort with inhalation with subjective fevers.  He checked his oxygen saturation at home and found it to be 72 and therefore presented to the emergency room.    Significant Events:  10/2 + CoViD test 10/5 mAb infusion  10/6 admit via [facility] 10/7 transfer to [facility]  Date of Positive COVID Test:  10/2  Vaccination Status: Unvaccinated against Covid  COVID-19 specific Treatment: Remdesivir 10/6 > 10/10 Steroid 10/6 > Baricitinib 10/6 >  Antimicrobials:  Azithromycin 10/6 > Ceftriaxone 10/6 >  DVT prophylaxis: Lovenox  Subjective: Afebrile.  Vital signs stable.  Saturations 88-91% on oxygen support. Feels less sob today. No cp or abdom pain.   Assessment & Plan:  COVID Pneumonia -acute hypoxic respiratory failure Continue steroid, and baricitinib -has completed a 5-day course of Remdesivir - wean oxygen as able - encouraged frequent use of incentive spirometer and flutter valve -desaturates profoundly with exertion therefore hospitalization likely to be prolonged  Recent Labs  Lab 04/13/20 2220 04/13/20 2240 04/15/20 0406 04/16/20 0439 04/17/20 0702 04/18/20 0544  DDIMER 0.65*  --  0.49 0.43 0.59* 0.46  FERRITIN  --  1,341* 1,219* 1,116* 984* 1,016*  CRP  --  13.6* 4.4* 1.6* 0.8 0.6  ALT 33  --  29 46* 50* 42  PROCALCITON 3.61  --  1.54  --   --   --     Possible bacterial  pneumonia Continue empiric antibiotic in setting of elevated procalcitonin -plan to discontinue antibiotics after 5-day treatment course  Acute kidney injury versus undiagnosed CKD crt 1.48 at admit -patient appears well-hydrated/euvolemic - no clear recent crt baseline -suspect he has a chronic undiagnosed CKD and this is likely his baseline creatinine -will need outpatient follow-up  Recent Labs  Lab 04/14/20 0659 04/15/20 0406 04/16/20 0439 04/17/20 0702 04/18/20 0544  CREATININE 1.47* 1.54* 1.69* 1.47* 1.59*    Mild hyponatremia Corrected with volume resuscitation  HTN Blood pressure currently well controlled  Hypothyroidism Continue usual Synthroid dose  Gout Quiescent presently  Code Status: FULL CODE Family Communication:  Status is: Inpatient  Remains inpatient appropriate because:Inpatient level of care appropriate due to severity of illness   Dispo: The patient is from: Home              Anticipated d/c is to: Home              Anticipated d/c date is: 3 days              Patient currently is not medically stable to d/c.   Consultants:  none  Objective: Blood pressure 139/88, pulse 66, temperature 98 F (36.7 C), temperature source Oral, resp. rate 16, height 5\' 7"  (1.702 m), weight 82.4 kg, SpO2 95 %.  Intake/Output Summary (Last 24 hours) at 04/18/2020 1037 Last data filed at 04/18/2020 0913 Gross per 24 hour  Intake 240 ml  Output 1500 ml  Net -1260 ml   Filed Weights   04/13/20 2204 04/14/20 0247  Weight: 85 kg 82.4 kg    Examination: General: No acute respiratory distress - alert and pleasant  Lungs: fine crackles B w/ improved air movement  Cardiovascular: Regular rate and rhythm w/o murmur  Abdomen: NT/ND, soft, BS+, no mass  Extremities: No C/C/E B LE    CBC: Recent Labs  Lab 04/16/20 0439 04/17/20 0702 04/18/20 0544  WBC 12.2* 13.7* 14.9*  NEUTROABS 10.1* 11.3* 13.3*  HGB 12.8* 13.2 13.4  HCT 38.7* 40.3 39.3  MCV 88.8  89.2 87.3  PLT 307 377 439*   Basic Metabolic Panel: Recent Labs  Lab 04/16/20 0439 04/17/20 0702 04/18/20 0544  NA 139 136 137  K 4.9 5.0 5.1  CL 107 105 105  CO2 21* 21* 21*  GLUCOSE 206* 229* 261*  BUN 34* 32* 36*  CREATININE 1.69* 1.47* 1.59*  CALCIUM 8.5* 8.3* 8.5*   GFR: Estimated Creatinine Clearance: 48.8 mL/min (A) (by C-G formula based on SCr of 1.59 mg/dL (H)).  Liver Function Tests: Recent Labs  Lab 04/15/20 0406 04/16/20 0439 04/17/20 0702 04/18/20 0544  AST 29 33 26 16  ALT 29 46* 50* 42  ALKPHOS 36* 41 38 42  BILITOT 0.1* 0.3 0.2* 0.3  PROT 6.0* 6.1* 6.0* 5.9*  ALBUMIN 2.5* 2.5* 2.4* 2.3*     Recent Results (from the past 240 hour(s))  Blood Culture (routine x 2)     Status: None (Preliminary result)   Collection Time: 04/13/20 10:20 PM   Specimen: BLOOD  Result Value Ref Range Status   Specimen Description   Final    BLOOD LEFT ANTECUBITAL Performed at [facility], [address] [place], [place] [address]    Special Requests   Final    BOTTLES DRAWN AEROBIC AND ANAEROBIC Blood Culture adequate volume Performed at [facility], [address] [place], [place] [address]    Culture   Final    NO GROWTH 4 DAYS Performed at [facility], [address], [place], [place] [address]    Report Status PENDING  Incomplete  Blood Culture (routine x 2)     Status: None (Preliminary result)   Collection Time: 04/14/20 12:45 AM   Specimen: BLOOD LEFT WRIST  Result Value Ref Range Status   Specimen Description   Final    BLOOD LEFT WRIST Performed at [facility], [address] [place], [place] [address]    Special Requests   Final    BOTTLES DRAWN AEROBIC AND ANAEROBIC Blood Culture adequate volume Performed at [facility], [address] [place], [place] [address]    Culture   Final    NO GROWTH 4 DAYS Performed at [facility], [address] [place], [place] [address]    Report  Status PENDING  Incomplete  MRSA PCR Screening     Status: None   Collection Time: 04/14/20  3:44 AM   Specimen: Nasal Mucosa; Nasopharyngeal  Result Value Ref Range Status   MRSA by PCR NEGATIVE NEGATIVE Final    Comment:        The GeneXpert MRSA Assay (FDA approved for NASAL specimens only), is one component of a comprehensive MRSA colonization surveillance program. It is not intended to diagnose MRSA infection nor to guide or monitor treatment for MRSA infections. Performed at [facility], [address], [place], [place] [address]  Scheduled Meds: . allopurinol  300 mg Oral Daily  . vitamin C  500 mg Oral Daily  . baricitinib  2 mg Oral Daily  . enoxaparin (LOVENOX) injection  40 mg Subcutaneous Q24H  . guaiFENesin  1,200 mg Oral BID  . levothyroxine  137 mcg Oral Q0600  . mouth rinse  15 mL Mouth Rinse BID  . methylPREDNISolone (SOLU-MEDROL) injection  80 mg Intravenous Q12H  . zinc sulfate  220 mg Oral Daily   Continuous Infusions: . sodium chloride Stopped (04/17/20 0432)     LOS: 4 days   Lonia Blood, MD [facility] Office  [phone] Pager - Text Page per Loretha Stapler  If 7PM-7AM, please contact night-coverage per Amion 04/18/2020, 10:37 AM

## 2020-04-19 DIAGNOSIS — U071 COVID-19: Secondary | ICD-10-CM | POA: Diagnosis not present

## 2020-04-19 LAB — CULTURE, BLOOD (ROUTINE X 2)
Culture: NO GROWTH
Culture: NO GROWTH
Special Requests: ADEQUATE
Special Requests: ADEQUATE

## 2020-04-19 MED ORDER — GUAIFENESIN ER 600 MG PO TB12: 1200.0000 mg | ORAL_TABLET | Freq: Two times a day (BID) | ORAL | 0 refills | Status: AC

## 2020-04-19 MED ORDER — GUAIFENESIN-DM 100-10 MG/5ML PO SYRP: 10.0000 mL | ORAL_SOLUTION | ORAL | 0 refills | Status: AC | PRN

## 2020-04-19 MED ORDER — PREDNISONE 50 MG PO TABS: 50.0000 mg | ORAL_TABLET | Freq: Every day | ORAL | 0 refills | Status: AC

## 2020-04-19 NOTE — Progress Notes (Signed)
SATURATION QUALIFICATIONS: (This note is used to comply with regulatory documentation for home oxygen)  Patient Saturations on Room Air at Rest = 93%  Patient Saturations on Room Air while Ambulating = 85%  Patient Saturations on 2 Liters of oxygen while Ambulating = 88%  Please briefly explain why patient needs home oxygen: Patient requires supplemental oxygen to maintain SpO2 >/88% with activity.  Ina Homes, PT, DPT Acute Rehabilitation Services  Pager [phone] Office [phone]

## 2020-04-19 NOTE — Progress Notes (Signed)
Physical Therapy Treatment Patient Details Name: Edwin Schneider MRN: [id number] DOB: Dec 16, 1955 Today's Date: 04/19/2020    History of Present Illness Pt is a 64 y.o. male who tested (+) COVID-19 on 04/09/20, now admitted 04/13/20 with worsening SOB requiring hospitalization. Workup for acute hypoxic respiratory frailure secondary to COVID PNA, AKI, mild hyponatremia. PMH includes gout, thyroid disease, ORIF pelvic fx and knee surgery.   PT Comments    Pt progressing very well with mobility; reports ready and wanting to d/c home as soon as possible, "I can do everything I need to do there with help from my family and Medicaid." Pt moving well, requiring 2L O2 to maintain SpO2 >/88% with activity (see saturations qualifications note). Reviewed educ re: current condition, oxygen needs, activity recommendations and importance of mobility. From a mobility perspective, feel pt safe to d/c home. Pt requesting HHPT services.    Follow Up Recommendations  Home health PT     Equipment Recommendations  Other (comment) (home O2)    Recommendations for Other Services       Precautions / Restrictions Restrictions Weight Bearing Restrictions: No    Mobility  Bed Mobility               General bed mobility comments: OOB in recliner  Transfers Overall transfer level: Independent Equipment used: None Transfers: Sit to/from Stand              Ambulation/Gait Ambulation/Gait assistance: Supervision Gait Distance (Feet): 300 Feet Assistive device: None Gait Pattern/deviations: Step-through pattern;Decreased stride length Gait velocity: Decreased   General Gait Details: Slow, mostly steady gait without DME, supervision for safety and monitoring SpO2. SpO2 down to 85% on RA, maintaining >/88% on 2L O2 Needles   Stairs             Wheelchair Mobility    Modified Rankin (Stroke Patients Only)       Balance Overall balance assessment: Needs assistance   Sitting  balance-Leahy Scale: Normal Sitting balance - Comments: Indep to don socks sitting in recliner     Standing balance-Leahy Scale: Good                              Cognition Arousal/Alertness: Awake/alert Behavior During Therapy: WFL for tasks assessed/performed Overall Cognitive Status: Within Functional Limits for tasks assessed                                 General Comments: WFL for simple tasks via [place] interpreter, also able to communicate with some English. Very repetitive regarding and focusing on wanting to d/c home, has Medicaid who can supply O2, etc.      Exercises Other Exercises Other Exercises: 10x sit<>stand    General Comments General comments (skin integrity, edema, etc.): Pt very focused on wanting to d/c home as soon as possible wtih assist from family (daughter works as Lawyer), stating, "Medicaid has offerred multiple times to send me home with oxygen and home care if I need it." Educ on current O2 needs, current condition and MD has final say for d/c date - pt endorses understanding      Pertinent Vitals/Pain Pain Assessment: No/denies pain Pain Intervention(s): Monitored during session    Home Living                      Prior Function  PT Goals (current goals can now be found in the care plan section) Progress towards PT goals: Progressing toward goals    Frequency    Min 3X/week      PT Plan Current plan remains appropriate    Co-evaluation              AM-PAC PT "6 Clicks" Mobility   Outcome Measure  Help needed turning from your back to your side while in a flat bed without using bedrails?: None Help needed moving from lying on your back to sitting on the side of a flat bed without using bedrails?: None Help needed moving to and from a bed to a chair (including a wheelchair)?: None Help needed standing up from a chair using your arms (e.g., wheelchair or bedside chair)?: None Help  needed to walk in hospital room?: None Help needed climbing 3-5 steps with a railing? : None 6 Click Score: 24    End of Session Equipment Utilized During Treatment: Oxygen Activity Tolerance: Patient tolerated treatment well Patient left: in chair;with call bell/phone within reach Nurse Communication: Mobility status PT Visit Diagnosis: Difficulty in walking, not elsewhere classified (R26.2)     Time: 5573-2202 PT Time Calculation (min) (ACUTE ONLY): 26 min  Charges:  $Therapeutic Exercise: 23-37 mins                     Ina Homes, PT, DPT Acute Rehabilitation Services  Pager [phone] Office [phone]  [name] 04/19/2020, 2:15 PM

## 2020-04-19 NOTE — Discharge Instructions (Signed)
Date of Positive COVID Test: 04/09/20  Date Isolation Ends: 04/30/20   Franco Nones de la prueba COVID positiva: 08/19/19  Franco Nones de finalizacin del aislamiento: 23/10/21  COVID-19 El COVID-19 es una infeccin respiratoria causada por un virus llamado coronavirus tipo 2 causante del sndrome respiratorio agudo grave (SARS-CoV-2). La enfermedad tambin se conoce como enfermedad por coronavirus o nuevo coronavirus. En algunas personas, el virus puede no ocasionar sntomas. En otras, puede producir una infeccin grave. La infeccin puede empeorar rpidamente y causar complicaciones, como:  Neumona o infeccin en los pulmones.  Sndrome de dificultad respiratoria aguda o SDRA. Es una afeccin que se caracteriza por la acumulacin de lquido en los pulmones, que impide que los pulmones se llenen de aire y pasen oxgeno a Risk manager.  Insuficiencia respiratoria aguda. Es una afeccin que se caracteriza porque no pasa suficiente oxgeno de los pulmones al cuerpo o porque el dixido de carbono no pasa de los pulmones hacia afuera del cuerpo.  Sepsis o choque sptico. Se trata de una reaccin grave del cuerpo ante una infeccin.  Problemas de coagulacin.  Infecciones secundarias debido a bacterias u hongos.  Falla de rganos. Ocurre cuando los rganos del cuerpo dejan de funcionar. El virus que causa el COVID-19 es contagioso. Esto significa que puede transmitirse de [place] persona a otra a travs de las gotitas de saliva de la tos y de los estornudos (secreciones respiratorias). Cules son las causas? Esta enfermedad es causada por un virus. Usted puede contagiarse con este virus:  Al inspirar las gotitas de una persona infectada. Las [employer] pueden diseminarse cuando una persona respira, habla, canta, tose o estornuda.  Al tocar algo, como una mesa o el picaportes de [place], que estuvo expuesto al virus (contaminado) y luego tocarse la boca, nariz o los ojos. Qu incrementa el  riesgo? Riesgo de infeccin Es ms probable que se infecte con este virus si:  Se encuentra a Social worker a 6 pies (2 metros) de Medical laboratory scientific officer con COVID-19.  Cuida o vive con una persona infectada con COVID-19.  Pasa tiempo en espacios interiores repletos de gente o vive en viviendas compartidas. Riesgo de enfermedad grave Es ms probable que se enferme gravemente por el virus si:  Tiene 50aos o ms. Cuanto mayor sea su edad, mayor ser el riesgo de tener una enfermedad grave.  Vive en un hogar de ancianos o centro de atencin a Air cabin crew.  Tiene cncer.  Tiene una enfermedad prolongada (crnica), como las siguientes: ? Enfermedad pulmonar crnica, que incluye la enfermedad pulmonar obstructiva crnica o asma. ? Una enfermedad crnica que disminuye la capacidad del cuerpo para combatir las infecciones (immunocomprometido). ? Enfermedad cardaca, que incluye insuficiencia cardaca, una afeccin que se caracteriza porque las arterias que llegan al corazn se Engineer, technical sales u obstruyen (arteriopata coronaria) o una enfermedad que provoca que el msculo cardaco se engrose, se debilite o endurezca (miocardiopata). ? Diabetes. ? Enfermedad renal crnica. ? Anemia drepanoctica, una enfermedad que se caracteriza porque los glbulos rojos tienen una forma anormal de hoz. ? Enfermedad heptica.  Es obeso. Cules son los signos o sntomas? Los sntomas de esta afeccin pueden ser de leves a graves. Los sntomas pueden aparecer en el trmino de 2 a [address] despus de haber estado expuesto al virus. Incluyen los siguientes:  Fiebre o escalofros.  Tos.  Dificultad para respirar.  Dolores de [place], dolores en el cuerpo o dolores musculares.  Secrecin o congestin nasal.  Dolor de garganta.  Nueva prdida del sentido  del gusto o del olfato. Algunas personas tambin pueden Matteltener problemas estomacales, como nuseas, vmitos o diarrea. Es posible que otras personas no tengan  sntomas de COVID-19. Cmo se diagnostica? Esta afeccin se puede diagnosticar en funcin de lo siguiente:  Sus signos y sntomas, especialmente si: ? Vive en una zona donde hay un brote de COVID-19. ? Viaj recientemente a una zona donde el virus es frecuente. ? Cuida o vive con Neomia Dearuna persona a quien se le diagnostic COVID-19. ? Odelia Gagestuvo expuesto a una persona a la que se le diagnostic COVID-19.  Un examen fsico.  Anlisis de laboratorio que pueden incluir: ? Tomar una muestra de lquido de la parte posterior de la nariz y la garganta (lquido nasofarngeo), la nariz o la garganta, con un hisopo. ? Una muestra de mucosidad de los pulmones (esputo). ? Anlisis de [place].  Los estudios de diagnstico por imgenes pueden incluir radiografas, exploracin por tomografa computarizada (TC) o ecografa. Cmo se trata? En este momento, no hay ningn medicamento para tratar el COVID-19. Los medicamentos para tratar otras enfermedades se usan a modo de ensayo para comprobar si son eficaces contra el COVID-19. El mdico le informar sobre las maneras de tratar los sntomas. En la Franklin Resourcesmayora de las personas, la infeccin es leve y puede controlarse en el hogar con reposo, lquidos y medicamentos de Saxisventa libre. El tratamiento para una infeccin grave suele realizarse en la unidad de cuidados intensivos (UCI) de un hospital. Puede incluir uno o ms de los siguientes. Estos tratamientos se administran hasta que los sntomas mejoran.  Recibir lquidos y United Parcelmedicamentos a travs de una va intravenosa.  Oxgeno complementario. Para administrar oxgeno extra, se [place] ([place]) Islandsutiliza un tubo en la Darene Lamernariz, una mascarilla o una campana de oxgeno.  Colocarlo para que se recueste boca abajo (decbito prono). Esto facilita el ingreso de oxgeno a los pulmones.  Uso continuo de Comorosuna mquina de presin positiva de las vas areas (CPAP) o de presin positiva de las vas areas de dos niveles (BPAP). Este tratamiento utiliza una  presin de aire leve para Pharmacologistmantener las vas respiratorias abiertas. Un tubo conectado a un motor administra oxgeno al cuerpo.  Respirador. Este tratamiento mueve el aire dentro y fuera de los pulmones mediante el uso de un tubo que se coloca en la trquea.  Traqueostoma. En este procedimiento se hace un orificio en el cuello para insertar un tubo de respiracin.  Oxigenacin por membrana extracorprea (OMEC). En este procedimiento, los pulmones tienen la posibilidad de recuperarse al asumir las funciones del corazn y los pulmones. Suministra oxgeno al cuerpo y elimina el dixido de carbono. Siga estas instrucciones en su casa: Estilo de vida  Si est enfermo, qudese en su casa, excepto para obtener atencin mdica. El mdico le indicar cunto tiempo debe quedarse en casa. Llame al mdico antes de buscar atencin mdica.  Haga reposo en su casa como se lo haya indicado el mdico.  No consuma ningn producto que contenga nicotina o tabaco, como cigarrillos, cigarrillos electrnicos y tabaco de Theatre managermascar. Si necesita ayuda para dejar de fumar, consulte al mdico.  Retome sus actividades normales segn lo indicado por el mdico. Pregntele al mdico qu actividades son seguras para usted. Instrucciones generales  Use los medicamentos de venta libre y los recetados solamente como se lo haya indicado el mdico.  Beba suficiente lquido como para Pharmacologistmantener la orina de color amarillo plido.  Concurra a todas las visitas de [address]segu[address] como se lo haya indicado el mdico. Esto es importante. Cmo  se evita?  No hay ninguna vacuna que ayude a prevenir la infeccin por COVID-19. Sin embargo, hay medidas que puede tomar para protegerse y Conservator, museum/gallery a Economist de este virus. Para protegerse:   No viaje a zonas donde el COVID-19 sea un riesgo. Las zonas donde se informa la presencia del COVID-19 cambian con frecuencia. Para identificar las zonas de alto riesgo y las restricciones de viaje,  consulte el sitio web de viajes de Building control surveyor for [employer] and Prevention Insurance claims handler) (Centros para el Control y la Prevencin de Event organiser): [email or web address].si  Si vive o debe viajar a una zona donde el COVID-19 es un riesgo, tome precauciones para evitar infecciones. ? Aljese de [employer]. ? Lvese las manos frecuentemente con agua y [place]. Use desinfectante para manos con alcohol si no dispone de [place] y [place]. ? Evite tocarse la boca, la cara, los ojos o la [place]. ? Evite salir de su casa, siga las indicaciones de su estado y de las autoridades sanitarias locales. ? Si debe salir de su casa, use un barbijo de tela o una mascarilla facial. Asegrese de que le cubra la nariz y la boca. ? Evite los espacios interiores repletos de gente. Mantenga una distancia de al menos 6 pies (2 metros) de Economist. ? Desinfecte los objetos y las superficies que se tocan con frecuencia todos [place]. Pueden incluir:  Encimeras y [place].  Picaportes e interruptores de luz.  Lavabos, fregaderos y grifos.  Aparatos electrnicos tales como telfonos, controles remotos, teclados, computadoras y tabletas. Cmo proteger a los dems: Si tiene sntomas de COVID-19, tome medidas para evitar que el virus se propague a Economist.  Si cree que tiene una infeccin por COVID-19, comunquese de inmediato con su mdico. Informe al equipo de atencin mdica que cree que puede tener una infeccin por el COVID-19.  Qudese en su casa. Salga de su casa solo para buscar atencin mdica. No utilice el transporte pblico.  No viaje mientras est enfermo.  Lvese las manos frecuentemente con agua y [place]. Usar desinfectante para manos con alcohol si no dispone de [place] y [place].  Mantngase alejado de quienes vivan con usted. Permita que los miembros de la familia sanos cuiden a los nios y las [place], si es posible. Si tiene que cuidar a los nios  o las mascotas, lvese las manos con frecuencia y use un barbijo. Si es posible, permanezca en su habitacin, separado de los dems. Utilice un bao diferente.  Asegrese de que todas las personas que viven en su casa se laven bien las manos y con frecuencia.  Tosa o estornude en un pauelo de papel o sobre su manga o codo. No tosa o estornude al aire ni se cubra la boca o la nariz con la [place].  Use un barbijo de tela o una mascarilla facial. Asegrese de que le cubra la nariz y la boca. Dnde buscar ms informacin  Centers for Disease Control and Prevention (Centros para el Control y la Prevencin de Event organiser): StickerEmporium.tn  World Health Organization (Organizacin Mundial de la Salud): [email or web address]/ Comunquese con un mdico si:  Vive o ha viajado a una zona donde el COVID-19 es un riesgo y tiene sntomas de infeccin.  Ha tenido contacto con alguien que tiene COVID-19 y usted tiene sntomas de infeccin. Solicite ayuda inmediatamente si:  Tiene dificultad para respirar.  Siente dolor u opresin en el pecho.  Experimenta confusin.  Tiene las uas de los dedos y  los labios de color azulado.  Tiene dificultad para despertarse.  Los sntomas empeoran. Estos sntomas pueden representar un problema grave que constituye Radio broadcast assistant. No espere a ver si los sntomas desaparecen. Solicite atencin mdica de inmediato. Comunquese con el servicio de emergencias de su localidad (911 en los [place]). No conduzca por sus propios medios [employer] hospital. Informe al personal mdico de emergencias si cree que tiene COVID-19. Resumen  El COVID-19 es una infeccin respiratoria causada por un virus. Tambin se conoce como enfermedad por coronavirus o nuevo coronavirus. Puede causar infecciones graves, como neumona, sndrome de dificultad respiratoria aguda, insuficiencia respiratoria aguda o sepsis.  El virus que causa el  COVID-19 es contagioso. Esto significa que puede transmitirse de [place] persona a otra a travs de las gotitas que se despiden al respirar, Heritage manager, cantar, toser y Engineering geologist.  Es ms probable que desarrolle una enfermedad grave si tiene 50 aos o ms, tiene el sistema inmunitario dbil, vive en un hogar de ancianos o tiene una enfermedad crnica.  No hay ningn medicamento para tratar el COVID-19. El mdico le informar sobre las maneras de tratar los sntomas.  Tome medidas para protegerse y Conservator, museum/gallery a los Merchandiser, retail las infecciones. Lvese las manos con frecuencia y desinfecte los objetos y las superficies que se tocan con frecuencia todos [place]. Mantngase alejado de las personas que estn enfermas y use un barbijo si est enfermo. Esta informacin no tiene Theme park manager el consejo del mdico. Asegrese de hacerle al mdico cualquier pregunta que tenga. Document Revised: 04/30/2019 Document Reviewed: 08/23/2018 Elsevier Patient Education  2020 Elsevier Inc.   COVID-19: Cmo protegerse y proteger a los dems COVID-19: How to Protect Yourself and Others Sepa cmo se propaga  Actualmente, no existe ninguna vacuna para prevenir la enfermedad por coronavirus 2019 (COVID-19).  La mejor forma de prevenir la enfermedad es evitar exponerse a este virus.  Se cree que el virus se transmite principalmente de [place] persona a [place]. ? [employer] que estn en contacto directo entre s (a una distancia inferior a 6 pies [1.56m]). ? A travs de las gotitas respiratorias producidas cuando una persona infectada tose, estornuda o habla. ? Estas gotitas pueden caer en la boca o en la nariz de las personas que estn cerca o pueden ser [employer] pulmones. ? El COVID-19 puede ser transmitido por personas que no presentan sntomas. Lo que todos deben hacer [employer] Group las manos con frecuencia  Lvese las manos con frecuencia con agua y jabn durante al menos 20 segundos, especialmente  despus de Oceanographer en un lugar pblico o despus de sonarse la nariz, toser o estornudar.  Si no dispone de [place] y [place], use un desinfectante de manos que contenga al menos un 60% de alcohol. Cubra todas las superficies de las manos y frtelas hasta que se sientan secas.  No se toque los ojos, la nariz y la boca sin antes lavarse las manos. Evite el contacto cercano  Limite el contacto directo con otras personas tanto como sea posible.  Evite el contacto cercano con personas que estn enfermas.  Establezca distancia entre usted y [employer]. ? Recuerde que [employer] no tienen sntomas pueden transmitir el virus. ? Esto es Software engineer para las personas que tienen ms riesgo de [email or web address] Cbrase la boca y la nariz con una mascarilla cuando est cerca de otras personas  Puede transmitir el COVID-19 a otras personas aunque no se sienta enfermo.  Todas las [employer]  deben Diplomatic Services operational officer en lugares pblicos y cuando estn con otras que no vivan en su casa, especialmente cuando el distanciamiento social sea difcil de [place]. ? Las mascarillas no deben colocarse a nios menores de 2 aos de [place], a las personas que tienen problemas respiratorios o que estn inconscientes, incapacitadas o que por algn motivo no puedan quitarse la mascarilla sin [place].  El propsito de Nurse, adult es proteger a Economist en caso de que usted est infectado.  NO utilice las [employer] a los trabajadores de [employer].  Contine manteniendo una distancia aproximada de 6 pies (1.19m) entre usted y [employer]. La mascarilla no reemplaza el distanciamiento social. Cbrase al toser y estornudar  Al toser o al estornudar, siempre cbrase la boca y la nariz con un pauelo descartable o use el interior del codo.  Deseche los pauelos descartables usados en la  basura.  Inmediatamente, lvese las manos con agua y [place] durante al menos 20segundos. Si no dispone de agua y [place], [employer] con un desinfectante de manos que contenga al menos un 60% de alcohol. Limpie y desinfecte  Limpie Y desinfecte las superficies que se tocan con frecuencia todos los [address]  [address]. Esto incluye mesas, picaportes, interruptores de luz, encimeras, mangos, escritorios, telfonos, teclados, inodoros, grifos y lavabos. [email or web address].[email or web address]  Si las superficies estn sucias, lmpielas: Use detergente o jabn y agua antes de la desinfeccin.  Luego, use un desinfectante domstico. Puede consultar una lista de los desinfectantes domsticos registrados en Financial controller (EPA) (Agencia de Proteccin Ambiental) aqu. [email or web address].[email or web address] 03/11/2019 Esta informacin no tiene Theme park manager el consejo del mdico. Asegrese de hacerle al mdico cualquier pregunta que tenga. Document Revised: 03/25/2019 Document Reviewed: 01/16/2019 Elsevier Patient Education  2020 [employer].

## 2020-04-19 NOTE — Discharge Summary (Signed)
DISCHARGE SUMMARY  Edwin Schneider  MR#: [id number]  DOB:1955-07-14  Date of Admission: 04/13/2020 Date of Discharge: 04/19/2020  Attending Physician:Aanvi Voyles Silvestre Gunner, MD  Patient's PCP: F/U as established at [facility] - Dr. Sharion Dove   Consults: none   Disposition: D/C home   Date of Positive COVID Test: 04/09/20  Date Isolation Ends: 04/30/20  COVID-19 specific Treatment: Remdesivir 10/6 > 10/10 Steroid 10/6 > 10/15 Baricitinib 10/6 > 10/12  Follow-up Appts:  Follow-up Information    [facility] Follow up.   Specialty: Home Health Services Why: Home Health Services arranged (RN/PT/OT). They will contact you at [phone] about 48 hours after discharge to schedule visit at home.  Contact information: [address] [place] [address] [phone]        [facility] Follow up.   Why: Oxygen to be delivered to the home prior to discharge.  Contact information: [address] [place] [address] [phone]               Tests Needing Follow-up: -determine if supplemental O2 w/ exertion is still needed -f/u renal fxn   Discharge Diagnoses: COVID Pneumonia Acute hypoxic respiratory failure Possible bacterial pneumonia Acute kidney injury versus undiagnosed CKD Mild hyponatremia HTN Hypothyroidism Gout  Initial presentation: 64yo with a history of HTN, hypothyroidism, and gout who was admitted from [facility] with progressively worsening COVID to the point of requiring high flow nasal cannula support.  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  He has not been vaccinated against Covid. He was diagnosed with Covid 10/2 and received a monoclonal antibody infusion 10/5. He reported 2 days of shortness of breath and chest discomfort with inhalation with subjective fevers.  He checked his oxygen saturation at home and found it to be 72 and  therefore presented to the emergency room.    Hospital Course: 10/2 + CoViD test 10/5 mAb infusion  10/6 admit via [facility] 10/7 transfer to [facility] 10/12 d/c home   COVID Pneumonia -acute hypoxic respiratory failure Continue steroid to complete 10 days of tx - stop baricitinib at time of d/c - has completed a 5-day course of Remdesivir - wean oxygen as able - encouraged frequent use of incentive spirometer and flutter valve -desaturated profoundly with exertion therefore hospitalization prolonged - this improved dramatically over the final 48hrs of hospital stay - at time of d/c sats on RA at rest 93%, and 85% on RA w/ exertion (corrects to 88% w/ 2L during exertion) - home O2 to be arranged and pt cleared for d/c home   Possible bacterial pneumonia Completed a 5 day course of empiric antibiotic in setting of elevated procalcitonin  Acute kidney injury versus undiagnosed CKD crt 1.48 at admit -patient appears well-hydrated/euvolemic - no clear recent crt baseline -suspect he has a chronic undiagnosed CKD and this is likely his baseline creatinine -will need outpatient follow-up  Mild hyponatremia Corrected with volume resuscitation  HTN Blood pressure well controlled  Hypothyroidism Continue usual Synthroid dose  Gout Quiescent presently    Allergies as of 04/19/2020   No Known Allergies     Medication List    STOP taking these medications   levofloxacin 500 MG tablet Commonly known as: LEVAQUIN     TAKE these medications   allopurinol 100 MG tablet Commonly known as: ZYLOPRIM Take 100 mg by mouth daily.   guaiFENesin 600 MG 12  hr tablet Commonly known as: MUCINEX Take 2 tablets (1,200 mg total) by mouth 2 (two) times daily.   guaiFENesin-dextromethorphan 100-10 MG/5ML syrup Commonly known as: ROBITUSSIN DM Take 10 mLs by mouth every 4 (four) hours as needed for cough.   levothyroxine 137 MCG tablet Commonly known as: SYNTHROID Take 137  mcg by mouth daily.   losartan 50 MG tablet Commonly known as: COZAAR Take 50 mg by mouth daily.   predniSONE 50 MG tablet Commonly known as: DELTASONE Take 1 tablet (50 mg total) by mouth daily with breakfast for 3 days. Start taking on: April 20, 2020   Ventolin HFA 108 (90 Base) MCG/ACT inhaler Generic drug: albuterol Inhale 1-2 puffs into the lungs as needed for wheezing or shortness of breath.            Durable Medical Equipment  (From admission, onward)         Start     Ordered   04/19/20 1430  For home use only DME oxygen  Once       Question Answer Comment  Length of Need 6 Months   Mode or (Route) Nasal cannula   Liters per Minute 2   Frequency Continuous (stationary and portable oxygen unit needed)   Oxygen conserving device Yes   Oxygen delivery system Gas      04/19/20 1429          Day of Discharge BP (!) 131/98 (BP Location: Right Wrist)   Pulse 86   Temp 98.2 F (36.8 C) (Axillary)   Resp 19   Ht 5\' 7"  (1.702 m)   Wt 82.4 kg   SpO2 97%   BMI 28.45 kg/m   Physical Exam: General: No acute respiratory distress Lungs: fine diffuse crackles - no wheezing - much improved air movement th/o  Cardiovascular: Regular rate and rhythm without murmur gallop or rub normal S1 and S2 Abdomen: Nontender, nondistended, soft, bowel sounds positive, no rebound, no ascites, no appreciable mass Extremities: No significant cyanosis, clubbing, or edema bilateral lower extremities  Basic Metabolic Panel: Recent Labs  Lab 04/13/20 2220 04/13/20 2220 04/14/20 0659 04/15/20 0406 04/16/20 0439 04/17/20 0702 04/18/20 0544  NA 130*  --   --  139 139 136 137  K 4.4  --   --  4.8 4.9 5.0 5.1  CL 100  --   --  107 107 105 105  CO2 19*  --   --  22 21* 21* 21*  GLUCOSE 162*  --   --  178* 206* 229* 261*  BUN 28*  --   --  29* 34* 32* 36*  CREATININE 1.48*   < > 1.47* 1.54* 1.69* 1.47* 1.59*  CALCIUM 8.3*  --   --  8.5* 8.5* 8.3* 8.5*   < > = values in  this interval not displayed.    Liver Function Tests: Recent Labs  Lab 04/13/20 2220 04/15/20 0406 04/16/20 0439 04/17/20 0702 04/18/20 0544  AST 45* 29 33 26 16  ALT 33 29 46* 50* 42  ALKPHOS 37* 36* 41 38 42  BILITOT 0.5 0.1* 0.3 0.2* 0.3  PROT 7.1 6.0* 6.1* 6.0* 5.9*  ALBUMIN 3.2* 2.5* 2.5* 2.4* 2.3*    CBC: Recent Labs  Lab 04/13/20 2220 04/13/20 2220 04/14/20 0659 04/15/20 0406 04/16/20 0439 04/17/20 0702 04/18/20 0544  WBC 14.5*   < > 10.3 9.7 12.2* 13.7* 14.9*  NEUTROABS 12.4*  --   --  8.0* 10.1* 11.3* 13.3*  HGB 14.1   < >  13.0 13.1 12.8* 13.2 13.4  HCT 40.8   < > 38.5* 38.4* 38.7* 40.3 39.3  MCV 86.3   < > 87.5 88.7 88.8 89.2 87.3  PLT 213   < > 203 252 307 377 439*   < > = values in this interval not displayed.     Recent Results (from the past 240 hour(s))  Blood Culture (routine x 2)     Status: None   Collection Time: 04/13/20 10:20 PM   Specimen: BLOOD  Result Value Ref Range Status   Specimen Description   Final    BLOOD LEFT ANTECUBITAL Performed at [facility], [address] [place], [place] [address]    Special Requests   Final    BOTTLES DRAWN AEROBIC AND ANAEROBIC Blood Culture adequate volume Performed at [facility], [address] [place], [place] [address]    Culture   Final    NO GROWTH 5 DAYS Performed at [facility], [address] [place], [place] [address]    Report Status 04/19/2020 FINAL  Final  Blood Culture (routine x 2)     Status: None   Collection Time: 04/14/20 12:45 AM   Specimen: BLOOD LEFT WRIST  Result Value Ref Range Status   Specimen Description   Final    BLOOD LEFT WRIST Performed at [facility], [address] [place], [place] [address]    Special Requests   Final    BOTTLES DRAWN AEROBIC AND ANAEROBIC Blood Culture adequate volume Performed at [facility], [address] [place], [place] [address]    Culture   Final    NO GROWTH 5  DAYS Performed at [facility], [address], [place], [place] [address]    Report Status 04/19/2020 FINAL  Final  MRSA PCR Screening     Status: None   Collection Time: 04/14/20  3:44 AM   Specimen: Nasal Mucosa; Nasopharyngeal  Result Value Ref Range Status   MRSA by PCR NEGATIVE NEGATIVE Final    Comment:        The GeneXpert MRSA Assay (FDA approved for NASAL specimens only), is one component of a comprehensive MRSA colonization surveillance program. It is not intended to diagnose MRSA infection nor to guide or monitor treatment for MRSA infections. Performed at [facility], [address], [place], [place] [address]       Time spent in discharge (includes decision making & examination of pt): 35 minutes  04/19/2020, 3:49 PM   Lonia Blood, MD [facility] Office  [phone]

## 2020-04-19 NOTE — Progress Notes (Signed)
Sharma Covert to be D/C'd Home per MD order.  Discussed with the patient and all questions fully answered.  VSS, Skin clean, dry and intact without evidence of skin break down, no evidence of skin tears noted. IV catheter discontinued intact. Site without signs and symptoms of complications. Dressing and pressure applied.  An After Visit Summary was printed and given to the patient. Oxygen was delivered and set up at home.  D/c education completed with patient/family including follow up instructions, medication list, d/c activities limitations if indicated, with other d/c instructions as indicated by MD - patient able to verbalize understanding, all questions fully answered. Patient also informed of precautions to take in regards to COVID-19 when at home.  Patient instructed to return to ED, call 911, or call MD for any changes in condition.   Patient escorted via WC, and D/C home via private auto.  Velva Harman 04/19/2020 6:12 PM

## 2020-04-19 NOTE — TOC Transition Note (Addendum)
Transition of Care [facility]) - CM/SW Discharge Note   Patient Details  Name: Edwin Schneider MRN: [id number] Date of Birth: Nov 28, 1955  [facility] CM/SW Contact:  Mearl Latin, LCSW Phone Number: 04/19/2020, 2:36 PM   Clinical Narrative:    CSW arranged O2 delivery through [employer]. They will deliver to patient's home (address confirmed) and patient's son, Onalee Hua, will bring portable tank for transport. No other DME needed. CSW made [facility] aware of discharge. Son and spouse reported Dr. Randel Pigg with [facility] is now patient's PCP and he has an appointment this week with him.    Final next level of care: Home w Home Health Services Barriers to Discharge: No Barriers Identified   Patient Goals and CMS Choice Patient states their goals for this hospitalization and ongoing recovery are:: Feel better CMS Medicare.gov Compare Post Acute Care list provided to:: Patient Choice offered to / list presented to : Patient  Discharge Placement                Patient to be transferred to facility by: Car Name of family member notified: Bunnie Domino Patient and family notified of of transfer: 04/19/20  Discharge Plan and Services   Discharge Planning Services: CM Consult Post Acute Care Choice: Home Health, Durable Medical Equipment          DME Arranged: Oxygen DME Agency: [facility] Date DME Agency Contacted: 04/19/20 Time DME Agency Contacted: 1433 Representative spoke with at DME Agency: Fayrene Fearing HH Arranged: RN, PT, OT [facility] Agency: [facility] Date [facility] Agency Contacted: 04/18/20 Time HH Agency Contacted: 1500 Representative spoke with at [facility] Agency: Denyse Amass  Social Determinants of Health (SDOH) Interventions     Readmission Risk Interventions No flowsheet data found.

## 2021-09-05 DIAGNOSIS — E785 Hyperlipidemia, unspecified: Secondary | ICD-10-CM | POA: Diagnosis not present

## 2021-09-05 DIAGNOSIS — I10 Essential (primary) hypertension: Secondary | ICD-10-CM | POA: Diagnosis not present

## 2021-09-05 DIAGNOSIS — K219 Gastro-esophageal reflux disease without esophagitis: Secondary | ICD-10-CM | POA: Diagnosis not present

## 2021-09-05 DIAGNOSIS — M25511 Pain in right shoulder: Secondary | ICD-10-CM | POA: Diagnosis not present

## 2021-09-05 DIAGNOSIS — E039 Hypothyroidism, unspecified: Secondary | ICD-10-CM | POA: Diagnosis not present

## 2021-09-05 DIAGNOSIS — I700 Atherosclerosis of aorta: Secondary | ICD-10-CM | POA: Diagnosis not present

## 2021-09-05 DIAGNOSIS — S46001A Unspecified injury of muscle(s) and tendon(s) of the rotator cuff of right shoulder, initial encounter: Secondary | ICD-10-CM | POA: Diagnosis not present

## 2021-11-02 DIAGNOSIS — R3121 Asymptomatic microscopic hematuria: Secondary | ICD-10-CM | POA: Diagnosis not present

## 2021-11-02 DIAGNOSIS — E039 Hypothyroidism, unspecified: Secondary | ICD-10-CM | POA: Diagnosis not present

## 2022-05-29 DIAGNOSIS — R509 Fever, unspecified: Secondary | ICD-10-CM | POA: Diagnosis not present

## 2022-05-29 DIAGNOSIS — Z20822 Contact with and (suspected) exposure to covid-19: Secondary | ICD-10-CM | POA: Diagnosis not present

## 2022-05-29 DIAGNOSIS — U071 COVID-19: Secondary | ICD-10-CM | POA: Diagnosis not present

## 2022-05-29 DIAGNOSIS — J029 Acute pharyngitis, unspecified: Secondary | ICD-10-CM | POA: Diagnosis not present

## 2022-06-04 DIAGNOSIS — I999 Unspecified disorder of circulatory system: Secondary | ICD-10-CM | POA: Diagnosis not present

## 2022-06-04 DIAGNOSIS — Z20822 Contact with and (suspected) exposure to covid-19: Secondary | ICD-10-CM | POA: Diagnosis not present

## 2022-06-04 DIAGNOSIS — R0981 Nasal congestion: Secondary | ICD-10-CM | POA: Diagnosis not present

## 2022-06-20 DIAGNOSIS — E039 Hypothyroidism, unspecified: Secondary | ICD-10-CM | POA: Diagnosis not present

## 2022-06-20 DIAGNOSIS — I10 Essential (primary) hypertension: Secondary | ICD-10-CM | POA: Diagnosis not present

## 2022-06-20 DIAGNOSIS — E785 Hyperlipidemia, unspecified: Secondary | ICD-10-CM | POA: Diagnosis not present

## 2022-06-20 DIAGNOSIS — R39198 Other difficulties with micturition: Secondary | ICD-10-CM | POA: Diagnosis not present

## 2022-06-20 DIAGNOSIS — K219 Gastro-esophageal reflux disease without esophagitis: Secondary | ICD-10-CM | POA: Diagnosis not present

## 2022-06-20 DIAGNOSIS — Z0000 Encounter for general adult medical examination without abnormal findings: Secondary | ICD-10-CM | POA: Diagnosis not present

## 2022-08-01 DIAGNOSIS — N138 Other obstructive and reflux uropathy: Secondary | ICD-10-CM | POA: Diagnosis not present

## 2022-08-01 DIAGNOSIS — R39198 Other difficulties with micturition: Secondary | ICD-10-CM | POA: Diagnosis not present

## 2022-08-01 DIAGNOSIS — R3129 Other microscopic hematuria: Secondary | ICD-10-CM | POA: Diagnosis not present

## 2022-09-27 IMAGING — DX DG CHEST 1V PORT
1 series · 1 of 1 positions shown · non-contrast
Comparison: 02/11/2014

CLINICAL DATA: Shortness of breath for 2 days.  COVID positive.

EXAM:
PORTABLE CHEST 1 VIEW

[chest ap]
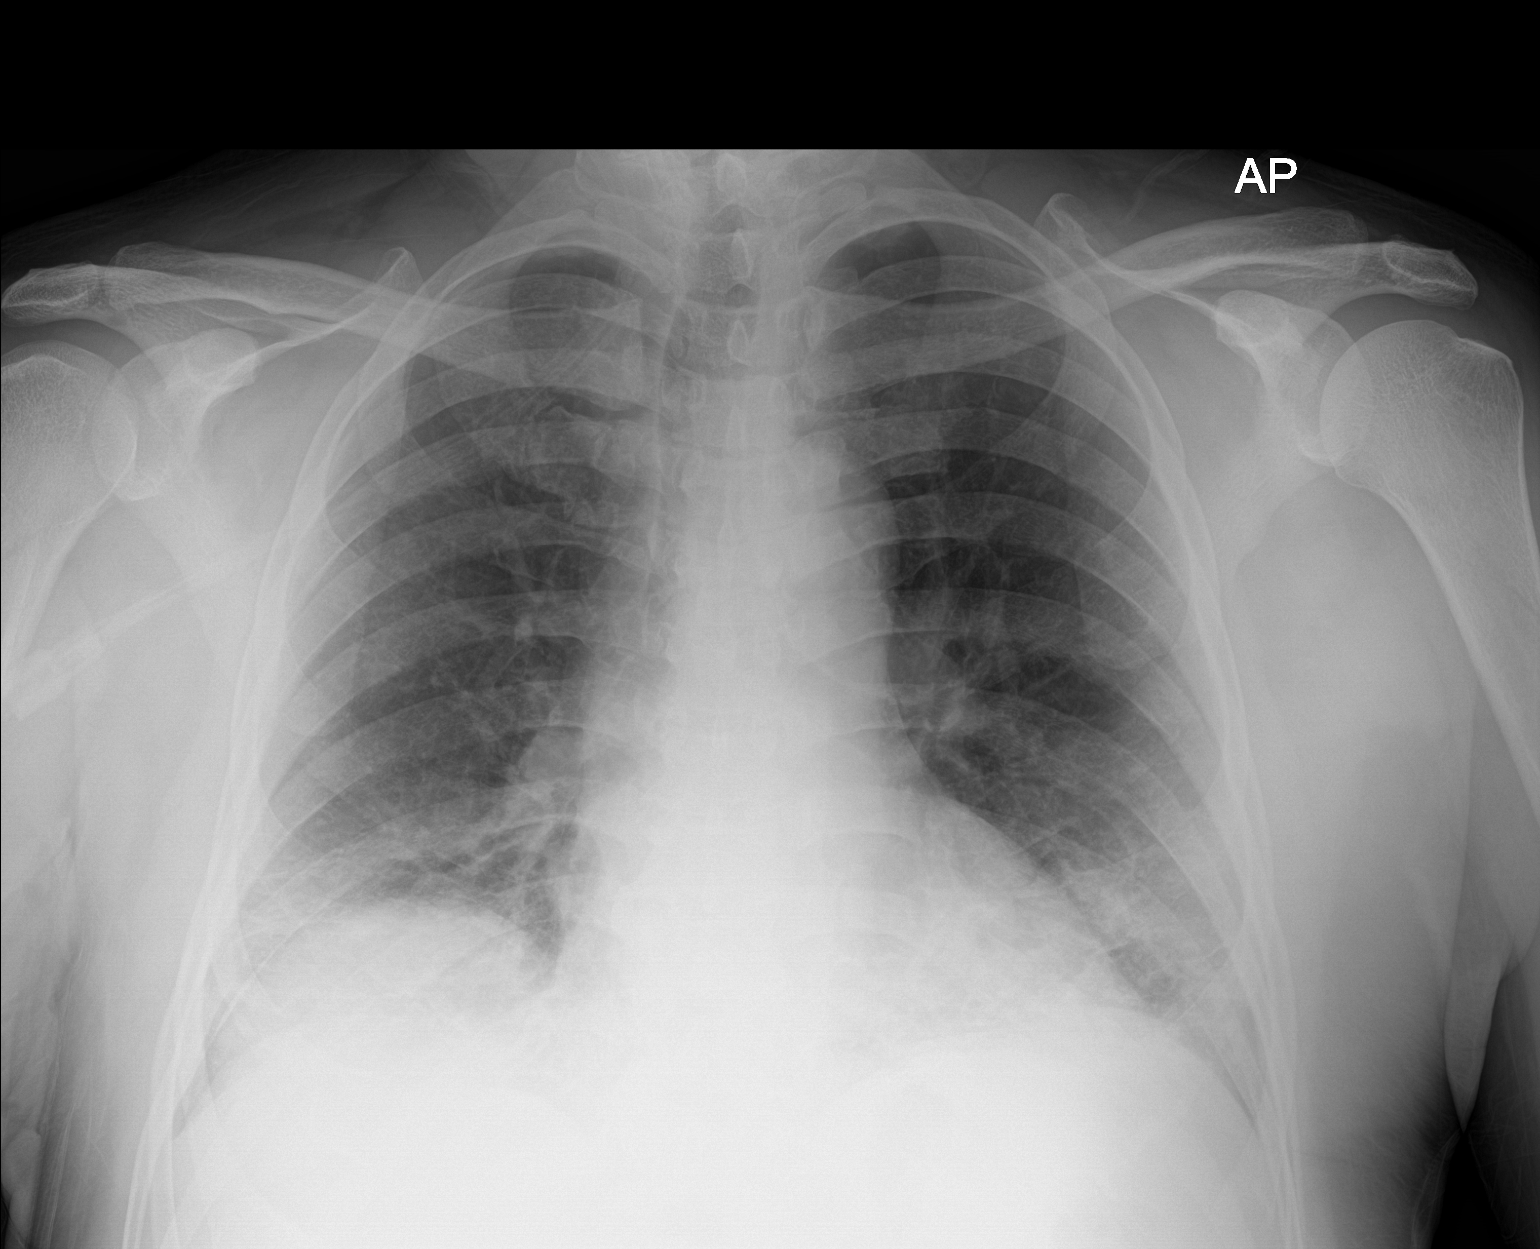

[1 of 1 positions shown; findings below may reference images not displayed]

FINDINGS: Stable heart size and mediastinal contours. Patchy heterogeneous
bilateral airspace opacities in a lower lung zone predominant
distribution. No pneumothorax or evidence of pneumomediastinum. No
significant pleural effusion. No acute osseous abnormalities are
seen.
IMPRESSION: Patchy heterogeneous bilateral airspace opacities consistent with
COVID pneumonia.

## 2023-03-25 ENCOUNTER — Encounter: Payer: Self-pay | Admitting: Orthopedic Surgery

## 2023-03-25 ENCOUNTER — Other Ambulatory Visit: Payer: Self-pay | Admitting: Orthopedic Surgery

## 2023-03-25 DIAGNOSIS — M5410 Radiculopathy, site unspecified: Secondary | ICD-10-CM

## 2023-03-25 DIAGNOSIS — M461 Sacroiliitis, not elsewhere classified: Secondary | ICD-10-CM

## 2023-03-26 ENCOUNTER — Other Ambulatory Visit: Payer: Self-pay | Admitting: Orthopedic Surgery

## 2023-03-26 DIAGNOSIS — M461 Sacroiliitis, not elsewhere classified: Secondary | ICD-10-CM

## 2023-04-03 ENCOUNTER — Ambulatory Visit
Admission: RE | Admit: 2023-04-03 | Discharge: 2023-04-03 | Disposition: A | Discharge status: Home / Self Care | Payer: Medicare Other | Source: Ambulatory Visit | Attending: Orthopedic Surgery | Admitting: Orthopedic Surgery

## 2023-04-03 DIAGNOSIS — M461 Sacroiliitis, not elsewhere classified: Secondary | ICD-10-CM
